# Patient Record
Sex: Male | Born: 1960 | Race: White | Hispanic: No | Marital: Married | Smoking: Former smoker
Health system: Southern US, Community
[De-identification: ages and names within clinical notes are randomized; demographics above are authoritative.]

## PROBLEM LIST (undated history)

## (undated) DIAGNOSIS — E785 Hyperlipidemia, unspecified: Secondary | ICD-10-CM

## (undated) DIAGNOSIS — T7840XA Allergy, unspecified, initial encounter: Secondary | ICD-10-CM

## (undated) DIAGNOSIS — K219 Gastro-esophageal reflux disease without esophagitis: Secondary | ICD-10-CM

## (undated) DIAGNOSIS — M5020 Other cervical disc displacement, unspecified cervical region: Secondary | ICD-10-CM

## (undated) HISTORY — DX: Gastro-esophageal reflux disease without esophagitis: K21.9

## (undated) HISTORY — PX: POLYPECTOMY: SHX149

## (undated) HISTORY — PX: COLON SURGERY: SHX602

## (undated) HISTORY — PX: COLONOSCOPY: SHX174

## (undated) HISTORY — DX: Allergy, unspecified, initial encounter: T78.40XA

## (undated) HISTORY — DX: Other cervical disc displacement, unspecified cervical region: M50.20

## (undated) HISTORY — DX: Hyperlipidemia, unspecified: E78.5

## (undated) HISTORY — PX: DENTAL SURGERY: SHX609

---

## 1998-06-18 ENCOUNTER — Emergency Department (HOSPITAL_COMMUNITY): Admission: EM | Admit: 1998-06-18 | Discharge: 1998-06-18 | Payer: Self-pay

## 2002-03-18 LAB — HM COLONOSCOPY

## 2003-12-16 ENCOUNTER — Emergency Department (HOSPITAL_COMMUNITY): Admission: EM | Admit: 2003-12-16 | Discharge: 2003-12-16 | Payer: Self-pay | Admitting: Emergency Medicine

## 2003-12-18 ENCOUNTER — Ambulatory Visit (HOSPITAL_COMMUNITY): Admission: RE | Admit: 2003-12-18 | Discharge: 2003-12-18 | Payer: Self-pay | Admitting: Internal Medicine

## 2005-02-18 ENCOUNTER — Ambulatory Visit: Payer: Self-pay | Admitting: Internal Medicine

## 2005-04-07 ENCOUNTER — Ambulatory Visit: Payer: Self-pay | Admitting: Internal Medicine

## 2005-11-21 ENCOUNTER — Ambulatory Visit: Payer: Self-pay | Admitting: Internal Medicine

## 2006-01-26 ENCOUNTER — Ambulatory Visit: Payer: Self-pay | Admitting: Gastroenterology

## 2007-02-03 ENCOUNTER — Ambulatory Visit: Payer: Self-pay | Admitting: Endocrinology

## 2007-08-27 ENCOUNTER — Encounter: Payer: Self-pay | Admitting: *Deleted

## 2007-08-27 DIAGNOSIS — E785 Hyperlipidemia, unspecified: Secondary | ICD-10-CM | POA: Insufficient documentation

## 2007-12-23 ENCOUNTER — Ambulatory Visit: Payer: Self-pay | Admitting: Internal Medicine

## 2008-03-23 ENCOUNTER — Ambulatory Visit: Payer: Self-pay | Admitting: Internal Medicine

## 2009-02-23 ENCOUNTER — Ambulatory Visit: Payer: Self-pay | Admitting: Internal Medicine

## 2010-07-29 ENCOUNTER — Telehealth: Payer: Self-pay | Admitting: Internal Medicine

## 2010-08-06 ENCOUNTER — Ambulatory Visit: Payer: Self-pay | Admitting: Internal Medicine

## 2010-08-06 LAB — CONVERTED CEMR LAB
ALT: 29 units/L (ref 0–53)
AST: 23 units/L (ref 0–37)
Albumin: 4.4 g/dL (ref 3.5–5.2)
Alkaline Phosphatase: 49 units/L (ref 39–117)
BUN: 17 mg/dL (ref 6–23)
Basophils Absolute: 0 10*3/uL (ref 0.0–0.1)
Basophils Relative: 0.5 % (ref 0.0–3.0)
Bilirubin Urine: NEGATIVE
Bilirubin, Direct: 0.1 mg/dL (ref 0.0–0.3)
CO2: 27 meq/L (ref 19–32)
Calcium: 9.3 mg/dL (ref 8.4–10.5)
Chloride: 105 meq/L (ref 96–112)
Cholesterol: 245 mg/dL — ABNORMAL HIGH (ref 0–200)
Creatinine, Ser: 1.2 mg/dL (ref 0.4–1.5)
Direct LDL: 176.2 mg/dL
Eosinophils Absolute: 0.2 10*3/uL (ref 0.0–0.7)
Eosinophils Relative: 3.5 % (ref 0.0–5.0)
GFR calc non Af Amer: 70.22 mL/min (ref >60)
Glucose, Bld: 87 mg/dL (ref 70–99)
HCT: 45.1 % (ref 39.0–52.0)
HDL: 51.1 mg/dL (ref >39.00)
Hemoglobin, Urine: NEGATIVE
Hemoglobin: 15.4 g/dL (ref 13.0–17.0)
Ketones, ur: NEGATIVE mg/dL
Leukocytes, UA: NEGATIVE
Lymphocytes Relative: 26.3 % (ref 12.0–46.0)
Lymphs Abs: 1.7 10*3/uL (ref 0.7–4.0)
MCHC: 34.2 g/dL (ref 30.0–36.0)
MCV: 87.2 fL (ref 78.0–100.0)
Monocytes Absolute: 0.6 10*3/uL (ref 0.1–1.0)
Monocytes Relative: 10.1 % (ref 3.0–12.0)
Neutro Abs: 3.8 10*3/uL (ref 1.4–7.7)
Neutrophils Relative %: 59.6 % (ref 43.0–77.0)
Nitrite: NEGATIVE
PSA: 0.38 ng/mL (ref 0.10–4.00)
Platelets: 190 10*3/uL (ref 150.0–400.0)
Potassium: 4.7 meq/L (ref 3.5–5.1)
RBC: 5.18 M/uL (ref 4.22–5.81)
RDW: 13.5 % (ref 11.5–14.6)
Sodium: 140 meq/L (ref 135–145)
Specific Gravity, Urine: 1.02 (ref 1.000–1.030)
TSH: 2.49 microintl units/mL (ref 0.35–5.50)
Total Bilirubin: 0.8 mg/dL (ref 0.3–1.2)
Total CHOL/HDL Ratio: 5
Total Protein, Urine: NEGATIVE mg/dL
Total Protein: 7 g/dL (ref 6.0–8.3)
Triglycerides: 127 mg/dL (ref 0.0–149.0)
Urine Glucose: NEGATIVE mg/dL
Urobilinogen, UA: 0.2 (ref 0.0–1.0)
VLDL: 25.4 mg/dL (ref 0.0–40.0)
WBC: 6.4 10*3/uL (ref 4.5–10.5)
pH: 7 (ref 5.0–8.0)

## 2010-08-13 ENCOUNTER — Ambulatory Visit: Payer: Self-pay | Admitting: Internal Medicine

## 2010-08-13 DIAGNOSIS — R6882 Decreased libido: Secondary | ICD-10-CM

## 2010-12-06 ENCOUNTER — Ambulatory Visit
Admission: RE | Admit: 2010-12-06 | Discharge: 2010-12-06 | Payer: Self-pay | Source: Home / Self Care | Attending: Internal Medicine | Admitting: Internal Medicine

## 2010-12-28 ENCOUNTER — Encounter: Payer: Self-pay | Admitting: Internal Medicine

## 2011-01-09 NOTE — Assessment & Plan Note (Signed)
Summary: swollen glands/cd   Vital Signs:  Patient profile:   50 year old male Height:      67 inches Weight:      164 pounds BMI:     25.78 O2 Sat:      97 % on Room air Temp:     97.4 degrees F oral Pulse rate:   66 / minute BP sitting:   138 / 84  (left arm) Cuff size:   regular  Vitals Entered By: Bill Salinas CMA (December 06, 2010 1:57 PM)  O2 Flow:  Room air CC: pt here for evaluation of swollen glands on the left side of his neck. Pt also has c/o cough with sinus pressure. Pt states he is not taking lovastatin/ ab   Primary Care Provider:  Jacques Navy MD  CC:  pt here for evaluation of swollen glands on the left side of his neck. Pt also has c/o cough with sinus pressure. Pt states he is not taking lovastatin/ ab.  History of Present Illness: Patinet presents for a swollen and pain in the left submandiblar/anterior cervical chain region for 2-3 days. He reports he did have some URI symptoms. The swollen gland was painful enough to disrupt sleep . He denies fever, rigors. the pain was like a tooth ache. This AM the left manidble is not tender and is OK.   Current Medications (verified): 1)  Lovastatin 20 Mg Tabs (Lovastatin) .Marland Kitchen.. 1 By Mouth Qpm  Allergies (verified): 1)  ! Cipro 2)  ! Ceftin  Past History:  Past Medical History: Last updated: 08/13/2010 HYPERLIPIDEMIA (ICD-272.4)  Past Surgical History: Last updated: 03/23/2008 dental surgery  Family History: Last updated: 03/23/2008 father-'37: lipid, CAD/CABG '08 mother-'40; Breast cancer - surgical cure Positive for CAD in many 2nd degree kin, maternla kin with hearing loss and ear     problems.  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  The patient denies anorexia, fever, weight loss, weight gain, decreased hearing,  hoarseness, chest pain, dyspnea on exertion, abdominal pain, severe indigestion/heartburn, muscle weakness, suspicious skin lesions, difficulty walking, depression, abnormal bleeding, and angioedema.         No Nausea or vomiting.  Physical Exam  General:  Well-developed,well-nourished,in no acute distress; alert,appropriate and cooperative throughout examination Head:  normocephalic, atraumatic, and no abnormalities observed.   Eyes:  vision grossly intact, pupils equal, pupils round, pupils react to accomodation, and corneas and lenses clear.   Ears:  R ear normal and L ear normal.   Mouth:  good dentition.  Throat clear. No tenderness to percussion against teeth. No mass in floor of the mouth Neck:  tender just  below the left submanibular gland at the anterior cervical chain. Palpable but not enlarged lymph node.  Lungs:  normal respiratory effort.   Heart:  normal rate and regular rhythm.     Impression & Recommendations:  Problem # 1:  ACUTE LYMPHADENITIS (ICD-683) Patient on exam with what is most likely to be lymphadenitis without other associated infection  Plan - Augmentin 875 two times a day for 7 days           heat or warm compresses; APAP for fever and pain.  His updated medication list for this problem includes:    Amoxicillin-pot Clavulanate 875-125 Mg Tabs (Amoxicillin-pot  clavulanate) .Marland Kitchen... 1 by mouth two times a day x 7  Complete Medication List: 1)  Lovastatin 20 Mg Tabs (Lovastatin) .Marland Kitchen.. 1 by mouth qpm 2)  Amoxicillin-pot Clavulanate 875-125 Mg Tabs (Amoxicillin-pot clavulanate) .Marland Kitchen.. 1 by mouth two times a day x 7  Patient Instructions: 1)  pain in the neck - most likely lymphadenitis. No primary site of infection noted: no tenderness to teeth or floor of the mouth and the ears look normal. Plan - Augmentin (generic) 875mg  two times a day x 7 days; Tylenol for pain and fever; warm compresses for swelling and discomfort. Call if this does not clear by Tuesday.    Prescriptions: AMOXICILLIN-POT CLAVULANATE 875-125 MG TABS (AMOXICILLIN-POT CLAVULANATE) 1 by mouth two times a day x 7  #14 x 0   Entered and Authorized by:   Jacques Navy MD   Signed by:   Jacques Navy MD on 12/06/2010   Method used:   Electronically to        [facility]* (retail)       [address]       [place], [place]  [address]       Ph: [phone]       Fax: [phone]   RxID:   [id number]    Orders Added: 1)  Est. Patient Level III [30160]

## 2011-01-09 NOTE — Assessment & Plan Note (Signed)
Summary: CPX /NWS  #   Vital Signs:  Patient profile:   50 year old male Height:      67 inches Weight:      162 pounds BMI:     25.46 O2 Sat:      95 % on Room air Temp:     97.3 degrees F oral Pulse rate:   73 / minute BP sitting:   110 / 80  (left arm) Cuff size:   regular  Vitals Entered By: Alysia Penna (August 13, 2010 2:18 PM)  O2 Flow:  Room air CC: cpx/ cp sma Comments pt will get tetanus today   Primary Care Graysin Luczynski:  Jacques Navy MD  CC:  cpx/ cp sma.  History of Present Illness: Patient presents for a general physical, the last being at age 46! He is feeling well and has no active problems.   Question about follow-up colonoscopy - had study at 42 for possible IBD. Will need to pull paper chart.  Has questions about ED and decreased libido. He does report that on the rare occasion when he and his wife had holiday alone there were no libido or ED issues. This suggests environmental obstacles.   Patient does have a history of hyperlipidemia in the setting of a family history positive for CAD. He had not follow up labs until labs done for this exam. He does exercise but admits that his diet is not a strict low fat diet.   Preventive Screening-Counseling & Management  Alcohol-Tobacco     Alcohol drinks/day: 1     Alcohol type: beer     Smoking Status: never  Caffeine-Diet-Exercise     Caffeine use/day: 1 cup a day     Diet Comments: poor     Diet Counseling: to improve diet; diet is suboptimal     Does Patient Exercise: yes     Type of exercise: aerobic, weights in season     Exercise (avg: min/session): 30-60     Exercise Counseling: to improve exercise regimen     MSH Depression Score: depression  Hep-HIV-STD-Contraception     STD Risk: no risk noted     Dental Visit-last 6 months yes     TSE monthly: yes     Testicular SE Education/Counseling to perform regular STE     Sun Exposure-Excessive: no  Safety-Violence-Falls     Seat Belt  Use: yes     Helmet Use: yes     Firearms in the Home: firearms in the home     Smoke Detectors: yes  Current Medications (verified): 1)  None  Allergies (verified): 1)  ! Cipro 2)  ! Ceftin  Past History:  Family History: Last updated: 03/23/2008 father-'37: lipid, CAD/CABG '08 mother-'40; Breast cancer - surgical cure Positive for CAD in many 2nd degree kin, maternla kin with hearing loss and ear     problems.  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 dental surgery  Social History: Smoking Status:  never Caffeine use/day:  1 cup a day Does Patient Exercise:  yes STD Risk:  no risk noted Dental Care w/in 6  mos.:  yes Sun Exposure-Excessive:  no Seat Belt Use:  yes  Review of Systems  The patient denies anorexia, fever, weight loss, weight gain, vision loss, decreased hearing, hoarseness, chest pain, syncope, dyspnea on exertion, peripheral edema, prolonged cough, headaches, hemoptysis, abdominal pain, melena, hematochezia, severe indigestion/heartburn, hematuria, incontinence, genital sores, muscle weakness, suspicious skin lesions, transient blindness, difficulty walking, depression, unusual weight change, enlarged lymph nodes, angioedema, and testicular masses.    Physical Exam  General:  Well-developed,well-nourished,in no acute distress; alert,appropriate and cooperative throughout examination Head:  Normocephalic and atraumatic without obvious abnormalities. No apparent alopecia or balding. Eyes:  No corneal or conjunctival  inflammation noted. EOMI. Perrla. Funduscopic exam benign, without hemorrhages, exudates or papilledema. Vision grossly normal. Ears:  External ear exam shows no significant lesions or deformities.  Otoscopic examination reveals clear canals, tympanic membranes are intact bilaterally without bulging, retraction, inflammation or discharge. Hearing is grossly normal bilaterally. Nose:  no external deformity, no external erythema, and no nasal discharge.   Mouth:  Oral mucosa and oropharynx without lesions or exudates.  Teeth in good repair. Neck:  supple, full ROM, no thyromegaly, and no carotid bruits.   Chest Wall:  no deformities.   Lungs:  Normal respiratory effort, chest expands symmetrically. Lungs are clear to auscultation, no crackles or wheezes. Heart:  Normal rate and regular rhythm. S1 and S2 normal without gallop, murmur, click, rub or other extra sounds. Abdomen:  soft, non-tender, normal bowel sounds, no guarding, and no hepatomegaly.   Genitalia:  circumcised, no hydrocele, no varicocele, no scrotal masses, and no testicular masses or atrophy.   Prostate:  deferred to PSA Msk:  normal ROM, no joint tenderness, no joint warmth, and no joint deformities.   Pulses:  2+ radial and DP pulses Extremities:  No clubbing, cyanosis, edema, or deformity noted with normal full range of motion of all joints.   Neurologic:  alert & oriented X3, cranial nerves II-XII intact, strength normal in all extremities, gait normal, and DTRs symmetrical and normal.   Skin:  turgor normal, color normal, no suspicious lesions, and no ulcerations.   Cervical Nodes:  no anterior cervical adenopathy and no posterior cervical adenopathy.   Axillary Nodes:  no R axillary adenopathy and no L axillary adenopathy.   Inguinal Nodes:  no R inguinal adenopathy and no L inguinal adenopathy.   Psych:  Oriented X3, normally interactive, good eye contact, and not anxious appearing.     Impression &  Recommendations:  Problem # 1:  DECREASED LIBIDO (ICD-799.81) By history this seems to be a situational/environmental issue with busy job and two teenagers - absence of time for romance.  Plan - testosterone level at next lab draw           long week-end holiday for the adults, every quarter would be ideal.  Problem # 2:  HYPERLIPIDEMIA (ICD-272.4)  His updated medication list for this problem includes:    Lovastatin 20 Mg Tabs (Lovastatin) .Marland Kitchen... 1 by mouth qpm  Labs Reviewed: SGOT: 23 (08/06/2010)   SGPT: 29 (08/06/2010)   HDL:51.10 (08/06/2010)  Chol:245 (08/06/2010)  Trig:127.0 (08/06/2010) LDL: 176(08/06/2010)  Patient with several cardiac risk factors including very positive family history. Per NCEP guidelines goal is an LDL 130 or less, lower always better.   Plan - will start Lovastatin 20mg  qPM. discussed mechanism of action, risks and follow-up strategy- labs in 3+ weeks           Low fat diet. At 4 months if continuing  on conscientious low fat diet will offer drug holiday and recheck of native cholesterol levels.            Problem # 3:  Preventive Health Care (ICD-V70.0) Unremarkable history. Normal phyiscal exam. Lab results, except for cholesterol, are normal. Recommend next full physical exam at age 40.  In summary - a very nice man in good health with hyperlipidemia that requires treatment. He will return for lipid follow-up as indicated, for full exam in 3 years.   Complete Medication List: 1)  Lovastatin 20 Mg Tabs (Lovastatin) .Marland Kitchen.. 1 by mouth qpm  Other Orders: Tdap => 48yrs IM ([address]) Admin 1st Vaccine (60454)   Patient: Cameron Larsen Note: All result statuses are Final unless otherwise noted.  Tests: (1) BMP (METABOL)   Sodium                    140 mEq/L                   135-145   Potassium                 4.7 mEq/L                   3.5-5.1   Chloride                  105 mEq/L                   96-112   Carbon Dioxide            27 mEq/L                     19-32   Glucose                   87 mg/dL                    09-81   BUN                       17 mg/dL                    1-91   Creatinine                1.2 mg/dL                   4.7-8.2   Calcium                   9.3 mg/dL                   9.5-62.1   GFR                       70.22 mL/min                >60  Tests: (2) CBC Platelet w/Diff (CBCD)   White Cell Count          6.4 K/uL                    4.5-10.5   Red Cell Count            5.18 Mil/uL                 4.22-5.81   Hemoglobin  15.4 g/dL                   04.5-40.9   Hematocrit                45.1 %                      39.0-52.0   MCV                       87.2 fl                     78.0-100.0   MCHC                      34.2 g/dL                   81.1-91.4   RDW                       13.5 %                      11.5-14.6   Platelet Count            190.0 K/uL                  150.0-400.0   Neutrophil %              59.6 %                      43.0-77.0   Lymphocyte %              26.3 %                      12.0-46.0   Monocyte %                10.1 %                      3.0-12.0   Eosinophils%              3.5 %                       0.0-5.0   Basophils %               0.5 %                       0.0-3.0   Neutrophill Absolute      3.8 K/uL                    1.4-7.7   Lymphocyte Absolute       1.7 K/uL                    0.7-4.0   Monocyte Absolute         0.6 K/uL                    0.1-1.0  Eosinophils, Absolute                             0.2 K/uL                    0.0-0.7  Basophils Absolute        0.0 K/uL                    0.0-0.1  Tests: (3) Hepatic/Liver Function Panel (HEPATIC)   Total Bilirubin           0.8 mg/dL                   8.2-9.5   Direct Bilirubin          0.1 mg/dL                   6.2-1.3   Alkaline Phosphatase      49 U/L                      39-117   AST                       23 U/L                      0-37   ALT                       29 U/L                       0-53   Total Protein             7.0 g/dL                    0.8-6.5   Albumin                   4.4 g/dL                    7.8-4.6  Tests: (4) TSH (TSH)   FastTSH                   2.49 uIU/mL                 0.35-5.50  Tests: (5) Lipid Panel (LIPID)   Cholesterol          [H]  245 mg/dL                   9-629     ATP III Classification            Desirable:  < 200 mg/dL                    Borderline High:  200 - 239 mg/dL               High:  > = 240 mg/dL   Triglycerides             127.0 mg/dL                 5.2-841.3     Normal:  <150 mg/dL     Borderline High:  244 - 199 mg/dL   HDL                       01.02 mg/dL                 >72.53   VLDL Cholesterol          25.4 mg/dL  0.0-40.0  CHO/HDL Ratio:  CHD Risk                             5                    Men          Women     1/2 Average Risk     3.4          3.3     Average Risk          5.0          4.4     2X Average Risk          9.6          7.1     3X Average Risk          15.0          11.0                           Tests: (6) UDip Only (UDIP)   Color                     LT. YELLOW       RANGE:  Yellow;Lt. Yellow   Clarity                   CLEAR                       Clear   Specific Gravity          1.020                       1.000 - 1.030   Urine Ph                  7.0                         5.0-8.0   Protein                   NEGATIVE                    Negative   Urine Glucose             NEGATIVE                    Negative   Ketones                   NEGATIVE                    Negative   Urine Bilirubin           NEGATIVE                    Negative   Blood                     NEGATIVE                    Negative   Urobilinogen              0.2  0.0 - 1.0   Leukocyte Esterace        NEGATIVE                    Negative   Nitrite                   NEGATIVE                    Negative  Tests: (7) Prostate Specific Antigen (PSA)   PSA-Hyb                    0.38 ng/mL                  0.10-4.00  Tests: (8) Cholesterol LDL - Direct (DIRLDL)  Cholesterol LDL - Direct                             176.2 mg/dL     Optimal:  <161 mg/dL     Near or Above Optimal:  100-129 mg/dL     Borderline High:  096-045 mg/dL     High:  409-811 mg/dL     Very High:  >914 mg/dL Prescriptions: LOVASTATIN 20 MG TABS (LOVASTATIN) 1 by mouth qPM  #30 x 12   Entered and Authorized by:   Jacques Navy MD   Signed by:   Jacques Navy MD on 08/13/2010   Method used:   Electronically to        [facility]* (retail)       [address]       [place], [place]  [address]       Ph: [phone]       Fax: [phone]   RxID:   [id number]    Immunizations Administered:  Tetanus Vaccine:    Vaccine Type: Tdap    Site: left deltoid    Mfr: GlaxoSmithKline    Dose: 0.5 ml    Route: IM    Given by: Ami Bullins CMA    Exp. Date: 08/28/2012    Lot #: [id number]    VIS given: 10/25/08 version given August 13, 2010.

## 2011-01-09 NOTE — Progress Notes (Signed)
Summary: CPX question  Phone Note Call from Patient Call back at Work Phone [phone]   Caller: Patient Summary of Call: Patient called [facility] stating that his last CPX was done 42yrs ago. He is requesting to know how often he should have this done, if it is time then pt would like to set up appt. Initial call taken by: Rock Nephew CMA,  July 29, 2010 2:03 PM  Follow-up for Phone Call        Patient notified that CPX is usually done yearly. Patient will schedule appt. Follow-up by: Lucious Groves CMA,  July 29, 2010 2:26 PM

## 2011-08-07 ENCOUNTER — Encounter: Payer: Self-pay | Admitting: Internal Medicine

## 2011-08-07 ENCOUNTER — Telehealth: Payer: Self-pay

## 2011-08-07 NOTE — Telephone Encounter (Signed)
Patient transferred from scheduling to be triage for chest tightness. Patient reports chest tightness x several days with occassional numbness, slight tingling down left arm. He states that he has had increased stress and thinks that may be the cause. MD spoke with patient and advised ok to see him 08/08/11 9am, however should anything change then report to ER overnight.

## 2011-08-08 ENCOUNTER — Ambulatory Visit (INDEPENDENT_AMBULATORY_CARE_PROVIDER_SITE_OTHER): Payer: PRIVATE HEALTH INSURANCE | Admitting: Internal Medicine

## 2011-08-08 VITALS — BP 138/92 | HR 89 | Temp 97.0°F | Wt 162.0 lb

## 2011-08-08 DIAGNOSIS — F411 Generalized anxiety disorder: Secondary | ICD-10-CM

## 2011-08-08 DIAGNOSIS — F419 Anxiety disorder, unspecified: Secondary | ICD-10-CM

## 2011-08-08 DIAGNOSIS — R0789 Other chest pain: Secondary | ICD-10-CM

## 2011-08-10 ENCOUNTER — Encounter: Payer: Self-pay | Admitting: Internal Medicine

## 2011-08-10 DIAGNOSIS — F419 Anxiety disorder, unspecified: Secondary | ICD-10-CM | POA: Insufficient documentation

## 2011-08-10 NOTE — Assessment & Plan Note (Signed)
Admits to anxiety and that he doesn't cope well. No serious problems at home and he does have good support.  Plan - recommended counseling/therapy to develop better coping mechanisms. Will provide referral upon request.

## 2011-08-10 NOTE — Progress Notes (Signed)
  Subjective:    Patient ID: Christiane Ha, male    DOB: 03/19/1961, 50 y.o.   MRN: [id number]  HPI Mr. Szczesny is seen acutely for left sided chest pain that started yesterday. The nature is sharp, fleeting and intermittent without associated symptoms of diaphoresis, SOB, radiation. Cardiac risk profile is low. He has had no limitation in his activities.  He reports that he is under a great deal of stress at work. He acknowledges that this has an adverse affect on him.  I have reviewed the patient's medical history in detail and updated the computerized patient record.    Review of Systems System review is negative for any constitutional, pulmonary, GI or neuro symptoms or complaints     Objective:   Physical Exam Vitals reviewed- normal Gen'l- WNWD white male in no distress HEENT - C&S clear Cor - 2+ radial pulse, quiet precordium, RRR no murmurs, rubs or gallops Chest - clear  12 lead EKG - normal sinus rhythm, no evidence of injury or acute ischemia.       Assessment & Plan:  1. Chest pain - atypical in nature, EKG normal, risk profile low-moderate.  Plan - no further evaluation at this time

## 2013-08-04 ENCOUNTER — Telehealth: Payer: Self-pay | Admitting: Internal Medicine

## 2013-08-04 DIAGNOSIS — Z1211 Encounter for screening for malignant neoplasm of colon: Secondary | ICD-10-CM

## 2013-08-04 NOTE — Telephone Encounter (Signed)
Patient is requesting a referral for a colonoscopy.

## 2013-08-04 NOTE — Telephone Encounter (Signed)
done

## 2013-09-23 ENCOUNTER — Encounter: Payer: Self-pay | Admitting: Internal Medicine

## 2014-04-17 ENCOUNTER — Telehealth: Payer: Self-pay | Admitting: Internal Medicine

## 2014-04-17 ENCOUNTER — Encounter (HOSPITAL_COMMUNITY): Payer: Self-pay | Admitting: Emergency Medicine

## 2014-04-17 ENCOUNTER — Emergency Department (HOSPITAL_COMMUNITY)
Admission: EM | Admit: 2014-04-17 | Discharge: 2014-04-17 | Disposition: A | Discharge status: Home / Self Care | Payer: PRIVATE HEALTH INSURANCE | Attending: Emergency Medicine | Admitting: Emergency Medicine

## 2014-04-17 ENCOUNTER — Emergency Department (HOSPITAL_COMMUNITY): Payer: PRIVATE HEALTH INSURANCE

## 2014-04-17 DIAGNOSIS — Z8639 Personal history of other endocrine, nutritional and metabolic disease: Secondary | ICD-10-CM | POA: Insufficient documentation

## 2014-04-17 DIAGNOSIS — Z862 Personal history of diseases of the blood and blood-forming organs and certain disorders involving the immune mechanism: Secondary | ICD-10-CM | POA: Insufficient documentation

## 2014-04-17 DIAGNOSIS — R1013 Epigastric pain: Secondary | ICD-10-CM

## 2014-04-17 DIAGNOSIS — F430 Acute stress reaction: Secondary | ICD-10-CM | POA: Insufficient documentation

## 2014-04-17 DIAGNOSIS — F411 Generalized anxiety disorder: Secondary | ICD-10-CM | POA: Insufficient documentation

## 2014-04-17 DIAGNOSIS — Z792 Long term (current) use of antibiotics: Secondary | ICD-10-CM | POA: Insufficient documentation

## 2014-04-17 LAB — COMPREHENSIVE METABOLIC PANEL
ALT: 32 U/L (ref 0–53)
AST: 21 U/L (ref 0–37)
Albumin: 3.9 g/dL (ref 3.5–5.2)
Alkaline Phosphatase: 57 U/L (ref 39–117)
BUN: 13 mg/dL (ref 6–23)
CO2: 23 meq/L (ref 19–32)
Calcium: 8.9 mg/dL (ref 8.4–10.5)
Chloride: 107 mEq/L (ref 96–112)
Creatinine, Ser: 0.86 mg/dL (ref 0.50–1.35)
GFR calc non Af Amer: >90 mL/min (ref >90)
Glucose, Bld: 85 mg/dL (ref 70–99)
POTASSIUM: 4.2 meq/L (ref 3.7–5.3)
SODIUM: 143 meq/L (ref 137–147)
Total Bilirubin: 0.3 mg/dL (ref 0.3–1.2)
Total Protein: 7 g/dL (ref 6.0–8.3)

## 2014-04-17 LAB — CBC
HCT: 45 % (ref 39.0–52.0)
Hemoglobin: 15.2 g/dL (ref 13.0–17.0)
MCH: 29.1 pg (ref 26.0–34.0)
MCHC: 33.8 g/dL (ref 30.0–36.0)
MCV: 86 fL (ref 78.0–100.0)
Platelets: 197 10*3/uL (ref 150–400)
RBC: 5.23 MIL/uL (ref 4.22–5.81)
RDW: 13.3 % (ref 11.5–15.5)
WBC: 6.1 10*3/uL (ref 4.0–10.5)

## 2014-04-17 LAB — I-STAT TROPONIN, ED
Troponin i, poc: 0 ng/mL (ref 0.00–0.08)
Troponin i, poc: 0 ng/mL (ref 0.00–0.08)

## 2014-04-17 LAB — LIPASE, BLOOD: Lipase: 49 U/L (ref 11–59)

## 2014-04-17 MED ORDER — OMEPRAZOLE 20 MG PO CPDR: 20.0000 mg | DELAYED_RELEASE_CAPSULE | Freq: Every day | ORAL | Status: DC

## 2014-04-17 MED ORDER — SODIUM CHLORIDE 0.9 % IV SOLN
INTRAVENOUS | Status: DC
Start: 2014-04-17 — End: 2014-04-17
  Administered 2014-04-17: 13:00:00 via INTRAVENOUS

## 2014-04-17 MED ORDER — GI COCKTAIL ~~LOC~~
30.0000 mL | Freq: Once | ORAL | Status: AC
  Administered 2014-04-17: 30 mL via ORAL
  Filled 2014-04-17: qty 30

## 2014-04-17 MED ORDER — PANTOPRAZOLE SODIUM 40 MG IV SOLR
40.0000 mg | Freq: Once | INTRAVENOUS | Status: AC
  Administered 2014-04-17: 40 mg via INTRAVENOUS
  Filled 2014-04-17: qty 40

## 2014-04-17 NOTE — ED Notes (Signed)
Labs for lipase and bmp recollected. PA notified of delay. Pt ambulatory to restroom, updated on delay. Family at bedside

## 2014-04-17 NOTE — Discharge Instructions (Signed)
Peptic Ulcer A peptic ulcer is a sore in the lining of in your esophagus (esophageal ulcer), stomach (gastric ulcer), or in the first part of your small intestine (duodenal ulcer). The ulcer causes erosion into the deeper tissue. CAUSES  Normally, the lining of the stomach and the small intestine protects itself from the acid that digests food. The protective lining can be damaged by:  An infection caused by a bacterium called Helicobacter pylori (H. pylori).  Regular use of nonsteroidal anti-inflammatory drugs (NSAIDs), such as ibuprofen or aspirin.  Smoking tobacco. Other risk factors include being older than 50, drinking alcohol excessively, and having a family history of ulcer disease.  SYMPTOMS   Burning pain or gnawing in the area between the chest and the belly button.  Heartburn.  Nausea and vomiting.  Bloating. The pain can be worse on an empty stomach and at night. If the ulcer results in bleeding, it can cause:  Black, tarry stools.  Vomiting of bright red blood.  Vomiting of coffee ground looking materials. DIAGNOSIS  A diagnosis is usually made based upon your history and an exam. Other tests and procedures may be performed to find the cause of the ulcer. Finding a cause will help determine the best treatment. Tests and procedures may include:  Blood tests, stool tests, or breath tests to check for the bacterium H. pylori.  An upper gastrointestinal (GI) series of the esophagus, stomach, and small intestine.  An endoscopy to examine the esophagus, stomach, and small intestine.  A biopsy. TREATMENT  Treatment may include:  Eliminating the cause of the ulcer, such as smoking, NSAIDs, or alcohol.  Medicines to reduce the amount of acid in your digestive tract.  Antibiotic medicines if the ulcer is caused by the H. pylori bacterium.  An upper endoscopy to treat a bleeding ulcer.  Surgery if the bleeding is severe or if the ulcer created a hole somewhere in the  digestive system. HOME CARE INSTRUCTIONS   Avoid tobacco, alcohol, and caffeine. Smoking can increase the acid in the stomach, and continued smoking will impair the healing of ulcers.  Avoid foods and drinks that seem to cause discomfort or aggravate your ulcer.  Only take medicines as directed by your caregiver. Do not substitute over-the-counter medicines for prescription medicines without talking to your caregiver.  Keep any follow-up appointments and tests as directed. SEEK MEDICAL CARE IF:   Your do not improve within 7 days of starting treatment.  You have ongoing indigestion or heartburn. SEEK IMMEDIATE MEDICAL CARE IF:   You have sudden, sharp, or persistent abdominal pain.  You have bloody or dark black, tarry stools.  You vomit blood or vomit that looks like coffee grounds.  You become light headed, weak, or feel faint.  You become sweaty or clammy. MAKE SURE YOU:   Understand these instructions.  Will watch your condition.  Will get help right away if you are not doing well or get worse. Document Released: 11/21/2000 Document Revised: 08/18/2012 Document Reviewed: 06/23/2012 [employer] Patient Information 2014 [employer].  

## 2014-04-17 NOTE — ED Notes (Signed)
PA student with patient at this time for update

## 2014-04-17 NOTE — ED Notes (Signed)
Diet coke given °

## 2014-04-17 NOTE — ED Provider Notes (Signed)
CSN: [id number]     Arrival date & time 04/17/14  1004 History   First MD Initiated Contact with Patient 04/17/14 1136     Chief Complaint  Patient presents with  . Chest Pain     (Consider location/radiation/quality/duration/timing/severity/associated sxs/prior Treatment) HPI  Patient to the ED with complaints of pain to the epigastric region that has been happening intermittently over the past 6 weeks. He reports that he is active, walking and jogging and does not have pain during physical activities. The three worst episodes of pain were after eating "really bad fried or fatty meals". He took [employer] with relief. He called his PCP today to schedule an appointment but they instead referred him to the ED. He admits to having a family hx of cardiac disease, his grandpa died this past week at 30 years old. Had a bypass at age 52. He is also concerned because his friend was having the same symptoms as him yesterday and went to the ER to be told he had a heart attack. He admits to being very anxious and under a lot of stress lately. He denies any nausea, vomiting, diaphoresis, diarrhea, weakness, headache, abdominal pain, hx of GERD or gastric ulcers.  Past Medical History  Diagnosis Date  . Hyperlipidemia    Past Surgical History  Procedure Laterality Date  . Dental surgery     Family History  Problem Relation Age of Onset  . Breast cancer Mother   . Coronary artery disease Father     CABG 2008  . Hyperlipidemia Father   . Coronary artery disease Other   . Hearing loss Other    History  Substance Use Topics  . Smoking status: Never Smoker   . Smokeless tobacco: Never Used  . Alcohol Use: Yes    Review of Systems   Review of Systems  Gen: no weight loss, fevers, chills, night sweats  Eyes: no discharge or drainage, no occular pain or visual changes  Nose: no epistaxis or rhinorrhea  Mouth: no dental pain, no sore throat  Neck: no neck pain  Lungs:No wheezing,  coughing or hemoptysis CV: + chest pain, No palpitations, dependent edema or orthopnea  Abd: no abdominal pain, nausea, vomiting, diarrhea GU: no dysuria or gross hematuria  MSK:  No muscle weakness or pain Neuro: no headache, no focal neurologic deficits  Skin: no rash or wounds Psyche: no complaints    Allergies  Cefuroxime axetil and Ciprofloxacin  Home Medications   Prior to Admission medications   Medication Sig Start Date End Date Taking? Authorizing Provider  acidophilus (RISAQUAD) CAPS capsule Take 1 capsule by mouth daily.   Yes Historical Provider, MD   BP 120/63  Pulse 67  Temp(Src) 98.1 F (36.7 C) (Oral)  Resp 18  Ht 5\' 7"  (1.702 m)  Wt 155 lb (70.308 kg)  BMI 24.27 kg/m2  SpO2 97% Physical Exam  Nursing note and vitals reviewed. Constitutional: He appears well-developed and well-nourished. No distress.  HENT:  Head: Normocephalic and atraumatic.  Eyes: Pupils are equal, round, and reactive to light.  Neck: Normal range of motion. Neck supple.  Cardiovascular: Normal rate and regular rhythm.   Pulmonary/Chest: Effort normal.  Abdominal: Soft.  Neurological: He is alert.  Skin: Skin is warm and dry.    ED Course  Procedures (including critical care time) Labs Review Labs Reviewed  CBC  LIPASE, BLOOD  COMPREHENSIVE METABOLIC PANEL  I-STAT TROPOININ, ED  Randolm Idol, ED    Imaging Review Dg  Chest 2 View  04/17/2014   CLINICAL DATA:  Mid chest pain, epigastric pain.  EXAM: CHEST  2 VIEW  COMPARISON:  None.  FINDINGS: The heart size and mediastinal contours are within normal limits. Both lungs are clear. The visualized skeletal structures are unremarkable.  IMPRESSION: No active cardiopulmonary disease.   Electronically Signed   By: Rolm Baptise M.D.   On: 04/17/2014 12:35     EKG Interpretation   Date/Time:  Monday Apr 17 2014 10:07:48 EDT Ventricular Rate:  80 PR Interval:  156 QRS Duration: 90 QT Interval:  366 QTC Calculation: 422 R  Axis:   51 Text Interpretation:  Normal sinus rhythm Normal ECG Confirmed by HORTON   MD, COURTNEY ([id number]) on 04/17/2014 11:29:19 AM      MDM   Final diagnoses:  Epigastric pain   The patients symptoms are not consistent with ACS. His symptoms are consistent with peptic ulcer. He was given a GI cocktail and Protonix and his symptoms completely resolved. He had two Troponins drawn, > 3 hours a part and both are negative. Normal chest xray and EKG. Patient will be started on Prilosec and given referrals to cardiology(per his request) and GI. He is reassured and comfortable going home. Given strict return to ED precautions. Pt discussed with Dr. Cassandria Anger before discharge and she agrees with treatment and plan.  53 y.o.Cameron Larsen [facility] evaluation in the Emergency Department is complete. It has been determined that no acute conditions requiring further emergency intervention are present at this time. The patient/guardian have been advised of the diagnosis and plan. We have discussed signs and symptoms that warrant return to the ED, such as changes or worsening in symptoms.  Vital signs are stable at discharge. Filed Vitals:   04/17/14 1628  BP: 123/85  Pulse: 58  Temp:   Resp: 16    Patient/guardian has voiced understanding and agreed to follow-up with the PCP or specialist.     Linus Mako, PA-C 04/17/14 1633

## 2014-04-17 NOTE — ED Notes (Signed)
Pt c/o epigastric pain onset last night. Pt reports diaphoresis and palpations at onset of pain. Pt tried alkaseltzer with some relief. Pt reports family history of heart disease.

## 2014-04-17 NOTE — Telephone Encounter (Signed)
Patient Information:  Caller Name: Audrick  Phone: [phone]  Patient: Cameron, Larsen  Gender: Male  DOB: Mar 08, 1961  Age: 53 Years  PCP: Dr Linda Hedges  Office Follow Up:  Does the office need to follow up with this patient?: No  Instructions For The Office: N/A  RN Note:  No chest discomfort since 0430. Chest pain felt  like "bad heartburn" with heart racing.  Symptoms resolved after took antacid. Noted > episodes of intermittent heartburn. Reports extra stressful weekend due to funeral for family member. Educated that chest pain for > 5 minutes means immediate 911 evaluation.  Reviewed risk factors including male > age 77, dyslipidemia, and stress.  Discussed office vs ED disposition with Neesha/office nurse.  Advised to go to [facility] ED now.  Symptoms  Reason For Call & Symptoms: Intermittent substernal chest pain.  Chest pain woke him at 0330 and lasted about 1 hour. Felt weakness and mild sweat on forehead.  Reviewed Health History In EMR: Yes  Reviewed Medications In EMR: Yes  Reviewed Allergies In EMR: Yes  Reviewed Surgeries / Procedures: Yes  Date of Onset of Symptoms: 04/17/2014  Treatments Tried: Took an antacid  Treatments Tried Worked: Yes  Guideline(s) Used:  Chest Pain  Disposition Per Guideline:   Go to ED Now (or to Office with PCP Approval)  Reason For Disposition Reached:   Chest pain lasting longer than 5 minutes  Advice Given:  Fleeting Chest Pain:  Fleeting chest pains that last only a few seconds and then go away are generally not serious. They may be from pinched muscles or nerves in your chest wall.  Chest Pain Only When Coughing:  Chest pains that occur with coughing generally come from the chest wall and from irritation of the airways. They are usually not serious.  Call Back If:  Severe chest pain  Constant chest pain lasting longer than 5 minutes  You become worse.  RN Overrode Recommendation:  Go To ED  Per Neesha/office nurse

## 2014-04-19 ENCOUNTER — Encounter: Payer: Self-pay | Admitting: Nurse Practitioner

## 2014-04-19 NOTE — ED Provider Notes (Signed)
Medical screening examination/treatment/procedure(s) were performed by non-physician practitioner and as supervising physician I was immediately available for consultation/collaboration.   EKG Interpretation   Date/Time:  Monday Apr 17 2014 10:07:48 EDT Ventricular Rate:  80 PR Interval:  156 QRS Duration: 90 QT Interval:  366 QTC Calculation: 422 R Axis:   51 Text Interpretation:  Normal sinus rhythm Normal ECG Confirmed by Dina Rich   MD, Marae Cottrell ([id number]) on 04/17/2014 11:29:19 AM        Merryl Hacker, MD 04/19/14 [phone]

## 2014-04-24 ENCOUNTER — Ambulatory Visit (INDEPENDENT_AMBULATORY_CARE_PROVIDER_SITE_OTHER): Payer: PRIVATE HEALTH INSURANCE | Admitting: Nurse Practitioner

## 2014-04-24 ENCOUNTER — Encounter: Payer: Self-pay | Admitting: Nurse Practitioner

## 2014-04-24 VITALS — BP 118/66 | HR 84 | Ht 67.0 in | Wt 166.0 lb

## 2014-04-24 DIAGNOSIS — K219 Gastro-esophageal reflux disease without esophagitis: Secondary | ICD-10-CM

## 2014-04-24 DIAGNOSIS — R1013 Epigastric pain: Secondary | ICD-10-CM

## 2014-04-24 DIAGNOSIS — Z1211 Encounter for screening for malignant neoplasm of colon: Secondary | ICD-10-CM

## 2014-04-24 MED ORDER — OMEPRAZOLE 20 MG PO CPDR: 20.0000 mg | DELAYED_RELEASE_CAPSULE | Freq: Every day | ORAL | Status: DC

## 2014-04-24 NOTE — Patient Instructions (Signed)
You have been scheduled for a colonoscopy with propofol. Please follow written instructions given to you at your visit today.  We sent a prescription for refills for Prilosec 20 mg to [facility]. If you use inhalers (even only as needed), please bring them with you on the day of your procedure. Your physician has requested that you go to www.startemmi.com and enter the access code given to you at your visit today. This web site gives a general overview about your procedure. However, you should still follow specific instructions given to you by our office regarding your preparation for the procedure.

## 2014-04-25 ENCOUNTER — Encounter: Payer: Self-pay | Admitting: Nurse Practitioner

## 2014-04-25 DIAGNOSIS — R1013 Epigastric pain: Secondary | ICD-10-CM | POA: Insufficient documentation

## 2014-04-25 DIAGNOSIS — K219 Gastro-esophageal reflux disease without esophagitis: Secondary | ICD-10-CM | POA: Insufficient documentation

## 2014-04-25 NOTE — Progress Notes (Signed)
HPI :  Patient is a 53 year old male known remotely to Dr. Deatra Ina.  Patient has been having intermittent ,nonradiating epigastric pain over the last few weeks. Pain not really related to eating. No associated nausea. Alka-Seltzer helps the pain. Sunday patient woke up with a severe episode of this pain. He was concerned about cardiac problems given family history of cardiac disease. Patient went to the emergency department for evaluation. Lipase, troponin, comprehensive metabolic profile and CBC were all normal. Chest x-ray was normal. Normal sinus rhythm on EKG. Patient was prescribed Prilosec. Since starting daily Prilosec he's had no further pain. He takes 1-2 ibuprofen once a week . Patient had a recent death in the family, he is prone to some mild anxiety and wonders if the severe pain on Sunday may have been related to anxiety. He is scheduled to see cardiology to rule out underlying cardiac disease.  Patient has a history of acid reflux which started last August. The gastric bouts of reflux but fine in between the episodes. No dysphagia.  No other gastrointestinal problems. Of note, patient had a flexible sigmoidoscopy March 2003 with findings of left-sided colitis. He had a full colonoscopy the following month with endoscopic findings of left-sided ulcerative colitis. Biopsies were apparently done but I am unable to locate them, (I even called the pathology department). The patient did take mesalamine for about a year. Following that he went on a mineral diet, discontinued mesalamine and has done well over the last 11-12 years. Bowel movements are normal, no rectal bleeding.   Past Medical History  Diagnosis Date  . Hyperlipidemia    Family History  Problem Relation Age of Onset  . Breast cancer Mother   . Coronary artery disease Father     CABG 2008  . Hyperlipidemia Father   . Coronary artery disease Other   . Hearing loss Other    History  Substance Use Topics  . Smoking  status: Never Smoker   . Smokeless tobacco: Never Used  . Alcohol Use: Yes   Current Outpatient Prescriptions  Medication Sig Dispense Refill  . acidophilus (RISAQUAD) CAPS capsule Take 1 capsule by mouth daily.      Marland Kitchen omeprazole (PRILOSEC) 20 MG capsule Take 1 capsule (20 mg total) by mouth daily.  30 capsule  3   No current facility-administered medications for this visit.   Allergies  Allergen Reactions  . Cefuroxime Axetil   . Ciprofloxacin     fever    Review of Systems: All systems reviewed and negative except where noted in HPI.   Physical Exam: BP 118/66  Pulse 84  Ht 5\' 7"  (1.702 m)  Wt 166 lb (75.297 kg)  BMI 25.99 kg/m2 Constitutional: Pleasant,well-developed, white male in no acute distress. HEENT: Normocephalic and atraumatic. Conjunctivae are normal. No scleral icterus. Neck supple.  Cardiovascular: Normal rate, regular rhythm.  Pulmonary/chest: Effort normal and breath sounds normal. No wheezing, rales or rhonchi. Abdominal: Soft, nondistended, nontender. Bowel sounds active throughout. There are no masses palpable. No hepatomegaly. Extremities: no edema Lymphadenopathy: No cervical adenopathy noted. Neurological: Alert and oriented to person place and time. Skin: Skin is warm and dry. No rashes noted. Psychiatric: Normal mood and affect. Behavior is normal.   ASSESSMENT AND PLAN:  38. 53 year old male with a several week history of intermittent, nonradiating epigastric pain, resolved with daily Prilosec prescribed by emergency department. ED labs and chest x-ray were normal. His abdominal exam is benign, no worrisome features such as weight loss and  symptoms improved on daily PPI. Will hold off on any further workup at this time. Patient will complete the 30 day supply of Prilosec. If pain recurs following that then patient will need further workup up, possibly EGD and/or imaging studies. Patient will see cardiology to rule out cardiac source of pain. I asked  him let us know if cardiac evaluation undertaken as we may need to postpone colonoscopy.   2. Colon cancer screening. The risks, benefits, and alternatives to colonoscopy with possible biopsy and possible polypectomy were discussed with the patient and he consents to proceed.   3. ? Remote history of UC (see HPI).Endoscopic UC findings in 2003 but histology not available and patient asymptomatic (off meds) for over 12 years now.

## 2014-04-25 NOTE — Progress Notes (Signed)
Reviewed and agree with management. Robert D. Kaplan, M.D., FACG  

## 2014-05-05 ENCOUNTER — Ambulatory Visit (INDEPENDENT_AMBULATORY_CARE_PROVIDER_SITE_OTHER): Payer: PRIVATE HEALTH INSURANCE | Admitting: Cardiology

## 2014-05-05 ENCOUNTER — Encounter: Payer: Self-pay | Admitting: Cardiology

## 2014-05-05 VITALS — BP 140/88 | HR 70 | Ht 67.0 in | Wt 167.0 lb

## 2014-05-05 DIAGNOSIS — E7800 Pure hypercholesterolemia, unspecified: Secondary | ICD-10-CM

## 2014-05-05 DIAGNOSIS — Z8249 Family history of ischemic heart disease and other diseases of the circulatory system: Secondary | ICD-10-CM

## 2014-05-05 DIAGNOSIS — R079 Chest pain, unspecified: Secondary | ICD-10-CM

## 2014-05-05 DIAGNOSIS — K219 Gastro-esophageal reflux disease without esophagitis: Secondary | ICD-10-CM

## 2014-05-05 LAB — LIPID PANEL
Cholesterol: 256 mg/dL — ABNORMAL HIGH (ref 0–200)
HDL: 51.2 mg/dL (ref >39.00)
LDL CALC: 173 mg/dL — AB (ref 0–99)
TRIGLYCERIDES: 160 mg/dL — AB (ref 0.0–149.0)
Total CHOL/HDL Ratio: 5
VLDL: 32 mg/dL (ref 0.0–40.0)

## 2014-05-05 NOTE — Progress Notes (Signed)
[facility]. [address], [address] [place], [place]  [address] Phone: [phone] Fax:  [phone]  Date:  05/05/2014   ID:  Gregary, Blackard 12-Jan-1961, MRN [id number]  PCP:  Adella Hare, MD   History of Present Illness: Cameron Larsen is a 53 y.o. male here for the evaluation of chest pain. He was seen in the emergency department on 04/17/14 with complaints of pain in the epigastric region that was happening intermittently over the past several weeks. He is quite active, jogging and really does not notice any exertional chest discomfort. Occasionally he does notice pain after fatty meals. Lower sternal, feels like burning, stress in family, ?GERD.   Grandfather died recently and was 57 year old. He had bypass at age 55. His friend had similar symptoms and was told that he had a heart attack. He is anxious. No bleeding, no diarrhea, no vomiting.  Following the emergency room visit on 04/25/14 he saw Dr. Deatra Ina with gastroenterology. His symptoms resolved with daily Prilosec prescribed in the ER. Labs were normal. Abdominal exam benign. No further workup. He was still recommended to see cardiology.   Wt Readings from Last 3 Encounters:  05/05/14 167 lb (75.751 kg)  04/24/14 166 lb (75.297 kg)  04/17/14 155 lb (70.308 kg)     Past Medical History  Diagnosis Date  . Hyperlipidemia     Past Surgical History  Procedure Laterality Date  . Dental surgery      Current Outpatient Prescriptions  Medication Sig Dispense Refill  . acidophilus (RISAQUAD) CAPS capsule Take 1 capsule by mouth daily. Pro biotic      . omeprazole (PRILOSEC) 20 MG capsule Take 1 capsule (20 mg total) by mouth daily.  30 capsule  3   No current facility-administered medications for this visit.    Allergies:    Allergies  Allergen Reactions  . Cefuroxime Axetil   . Ciprofloxacin     fever    Social History:  The patient  reports that he has never smoked. He has never used smokeless tobacco.  He reports that he drinks alcohol. He reports that he does not use illicit drugs.   Family History  Problem Relation Age of Onset  . Breast cancer Mother   . Coronary artery disease Father     CABG 2008  . Hyperlipidemia Father   . Coronary artery disease Other   . Hearing loss Other     ROS:  Please see the history of present illness.   Denies any syncope, bleeding, orthopnea, PND, melena, rash.   All other systems reviewed and negative.   PHYSICAL EXAM: VS:  BP 140/88  Pulse 70  Ht 5\' 7"  (1.702 m)  Wt 167 lb (75.751 kg)  BMI 26.15 kg/m2 Well nourished, well developed, in no acute distress HEENT: normal, /AT, EOMI Neck: no JVD, normal carotid upstroke, no bruit Cardiac:  normal S1, S2; RRR; no murmur Lungs:  clear to auscultation bilaterally, no wheezing, rhonchi or rales Abd: soft, nontender, no hepatomegaly, no bruits Ext: no edema, 2+ distal pulses Skin: warm and dry GU: deferred Neuro: no focal abnormalities noted, AAO x 3  EKG:  04/17/14-sinus rhythm, no other abnormalities  Chest x-ray: 04/17/14-personally viewed, no abnormalities Labs-04/17/14 -troponin normal, ALT 32, creatinine 0.86, hemoglobin 15.2, LDL 176 in 2011   ASSESSMENT AND PLAN:  1. Chest pain-reassuring troponin, EKG, chest x-ray. Recent GI office visit. I do not think that it is unreasonable to proceed with stress testing  for further risk evaluation, assessment of ischemia. 2. Family history of CAD-hyperlipidemia-I will check a lipid panel. In the past, his LDL was 176 in 2011. He has been trying diet and exercise. Dr. Crissie Sickles was his previous primary physician, now retired. He may be proceeding with Dr. Damita Dunnings. If his LDL remains this elevated, with his family history, I will recommend statin therapy. We discussed the benefits. He is worried about potential side effects. 3. Will followup with stress test. If abnormal or if cholesterol elevated, we may go ahead and start statin therapy and may have  followup in that situation.  Signed, Candee Furbish, MD [facility]  05/05/2014 2:16 PM

## 2014-05-05 NOTE — Patient Instructions (Signed)
Schedule Treadmill   Follow instructions given    Lab Today   ( lipid panel )    Follow up as needed

## 2014-05-09 ENCOUNTER — Other Ambulatory Visit: Payer: Self-pay | Admitting: *Deleted

## 2014-05-09 DIAGNOSIS — E7800 Pure hypercholesterolemia, unspecified: Secondary | ICD-10-CM

## 2014-05-09 MED ORDER — ATORVASTATIN CALCIUM 20 MG PO TABS: 20.0000 mg | ORAL_TABLET | Freq: Every day | ORAL | Status: DC

## 2014-05-19 ENCOUNTER — Encounter: Payer: Self-pay | Admitting: Gastroenterology

## 2014-05-23 ENCOUNTER — Ambulatory Visit (AMBULATORY_SURGERY_CENTER): Payer: PRIVATE HEALTH INSURANCE | Admitting: Gastroenterology

## 2014-05-23 ENCOUNTER — Encounter: Payer: Self-pay | Admitting: Gastroenterology

## 2014-05-23 VITALS — BP 112/82 | HR 59 | Temp 97.8°F | Resp 16 | Ht 67.0 in | Wt 166.0 lb

## 2014-05-23 DIAGNOSIS — D129 Benign neoplasm of anus and anal canal: Secondary | ICD-10-CM

## 2014-05-23 DIAGNOSIS — D128 Benign neoplasm of rectum: Secondary | ICD-10-CM

## 2014-05-23 DIAGNOSIS — D126 Benign neoplasm of colon, unspecified: Secondary | ICD-10-CM

## 2014-05-23 DIAGNOSIS — Z1211 Encounter for screening for malignant neoplasm of colon: Secondary | ICD-10-CM

## 2014-05-23 MED ORDER — SODIUM CHLORIDE 0.9 % IV SOLN: 500.0000 mL | INTRAVENOUS | Status: DC

## 2014-05-23 NOTE — Patient Instructions (Signed)
YOU HAD AN ENDOSCOPIC PROCEDURE TODAY AT THE [facility]: Refer to the procedure report that was given to you for any specific questions about what was found during the examination.  If the procedure report does not answer your questions, please call your gastroenterologist to clarify.  If you requested that your care partner not be given the details of your procedure findings, then the procedure report has been included in a sealed envelope for you to review at your convenience later.  YOU SHOULD EXPECT: Some feelings of bloating in the abdomen. Passage of more gas than usual.  Walking can help get rid of the air that was put into your GI tract during the procedure and reduce the bloating. If you had a lower endoscopy (such as a colonoscopy or flexible sigmoidoscopy) you may notice spotting of blood in your stool or on the toilet paper. If you underwent a bowel prep for your procedure, then you may not have a normal bowel movement for a few days.  DIET: Your first meal following the procedure should be a light meal and then it is ok to progress to your normal diet.  A half-sandwich or bowl of soup is an example of a good first meal.  Heavy or fried foods are harder to digest and may make you feel nauseous or bloated.  Likewise meals heavy in dairy and vegetables can cause extra gas to form and this can also increase the bloating.  Drink plenty of fluids but you should avoid alcoholic beverages for 24 hours.  ACTIVITY: Your care partner should take you home directly after the procedure.  You should plan to take it easy, moving slowly for the rest of the day.  You can resume normal activity the day after the procedure however you should NOT DRIVE or use heavy machinery for 24 hours (because of the sedation medicines used during the test).    SYMPTOMS TO REPORT IMMEDIATELY: A gastroenterologist can be reached at any hour.  During normal business hours, 8:30 AM to 5:00 PM Monday through Friday,  call [phone].  After hours and on weekends, please call the GI answering service at [phone] who will take a message and have the physician on call contact you.   Following lower endoscopy (colonoscopy or flexible sigmoidoscopy):  Excessive amounts of blood in the stool  Significant tenderness or worsening of abdominal pains  Swelling of the abdomen that is new, acute  Fever of 100F or higher  FOLLOW UP: If any biopsies were taken you will be contacted by phone or by letter within the next 1-3 weeks.  Call your gastroenterologist if you have not heard about the biopsies in 3 weeks.  Our staff will call the home number listed on your records the next business day following your procedure to check on you and address any questions or concerns that you may have at that time regarding the information given to you following your procedure. This is a courtesy call and so if there is no answer at the home number and we have not heard from you through the emergency physician on call, we will assume that you have returned to your regular daily activities without incident.  SIGNATURES/CONFIDENTIALITY: You and/or your care partner have signed paperwork which will be entered into your electronic medical record.  These signatures attest to the fact that that the information above on your After Visit Summary has been reviewed and is understood.  Full responsibility of the confidentiality of this   discharge information lies with you and/or your care-partner.  Recommendations If the polyp removed today is an adenomatous polyp (pre-cancerous) polyp, you will need a repeat colonoscopy in 3 years. Otherwise continue to follow current guidelines for "routine risk" patients with a repeat colonoscopy in 10 years.

## 2014-05-23 NOTE — Progress Notes (Signed)
Called to room to assist during endoscopic procedure.  Patient ID and intended procedure confirmed with present staff. Received instructions for my participation in the procedure from the performing physician.  

## 2014-05-23 NOTE — Progress Notes (Signed)
Report to PACU, RN, vss, BBS= Clear.  

## 2014-05-23 NOTE — Op Note (Signed)
[facility]  [address] [place], [id number]   COLONOSCOPY PROCEDURE REPORT  PATIENT: Cameron Larsen, Cameron Larsen  MR#: [id number] BIRTHDATE: [birth year] , 53  yrs. old GENDER: Male ENDOSCOPIST: Inda Castle, MD REFERRED BY: PROCEDURE DATE:  05/23/2014 PROCEDURE:   Colonoscopy with snare polypectomy and Submucosal injection, any substance First Screening Colonoscopy - Avg.  risk and is 50 yrs.  old or older - No.  Prior Negative Screening - Now for repeat screening. 10 or more years since last screening  History of Adenoma - Now for follow-up colonoscopy & has been > or = to 3 yrs.  N/A  Polyps Removed Today? Yes. ASA CLASS:   Class II INDICATIONS:Average risk patient for colon cancer.  personal history of left-sided colitis 2003 MEDICATIONS: MAC sedation, administered by CRNA and Propofol (Diprivan) 320 mg IV  DESCRIPTION OF PROCEDURE:   After the risks benefits and alternatives of the procedure were thoroughly explained, informed consent was obtained.  A digital rectal exam revealed no abnormalities of the rectum.   The LB [id number]  endoscope was introduced through the anus and advanced to the cecum, which was identified by both the appendix and ileocecal valve. No adverse events experienced.   The quality of the prep was excellent, using Trilyte  The instrument was then slowly withdrawn as the colon was fully examined.      COLON FINDINGS: A smooth flat polyp measuring 2 cm in size was found in the ascending colon.  Endoscopic mucosal resection was performed by injecting 6cc saline into the submucosa to raise the lesion and polypectomy was performed with snare cautery.  The polyp lifted well after submucosal injection The polyp lifted well after submucosal injection.  The resection was complete and the polyp tissue was completely retrieved.   The colon was otherwise normal. There was no diverticulosis, inflammation, polyps or cancers  unless previously stated.  Retroflexed views revealed no abnormalities. The time to cecum=3 minutes 18 seconds.  Withdrawal time=12 minutes 38 seconds.  The scope was withdrawn and the procedure completed. COMPLICATIONS: There were no complications.  ENDOSCOPIC IMPRESSION: 1.   Flat polyp measuring 2 cm in size was found in the ascending colon; endoscopic mucosal resection was performed 2.   The colon was otherwise normal  RECOMMENDATIONS: If the polyp(s) removed today are proven to be adenomatous (pre-cancerous) polyps, you will need a colonoscopy in 3 years. Otherwise you should continue to follow colorectal cancer screening guidelines for "routine risk" patients with a colonoscopy in 10 years.  You will receive a letter within 1-2 weeks with the results of your biopsy as well as final recommendations.  Please call my office if you have not received a letter after 3 weeks.   eSigned:  Inda Castle, MD 05/23/2014 3:53 PM   cc: Neena Rhymes, MD and Marylynn Pearson MD   PATIENT NAME:  Cameron Larsen, Cameron Larsen MR#: [id number]

## 2014-05-24 ENCOUNTER — Telehealth: Payer: Self-pay

## 2014-05-24 NOTE — Telephone Encounter (Signed)
  Follow up Call-  Call back number 05/23/2014  Post procedure Call Back phone  # [phone]  Permission to leave phone message Yes     Patient questions:  Do you have a fever, pain , or abdominal swelling? no Pain Score  0 *  Have you tolerated food without any problems? yes  Have you been able to return to your normal activities? yes  Do you have any questions about your discharge instructions: Diet   no Medications  no Follow up visit  no  Do you have questions or concerns about your Care? no  Actions: * If pain score is 4 or above: No action needed, pain <4.

## 2014-05-31 ENCOUNTER — Encounter: Payer: Self-pay | Admitting: Gastroenterology

## 2014-06-13 ENCOUNTER — Telehealth (HOSPITAL_COMMUNITY): Payer: Self-pay

## 2014-06-13 NOTE — Telephone Encounter (Signed)
Encounter complete. 

## 2014-06-14 ENCOUNTER — Telehealth (HOSPITAL_COMMUNITY): Payer: Self-pay

## 2014-06-14 NOTE — Telephone Encounter (Signed)
Encounter complete. 

## 2014-06-15 ENCOUNTER — Ambulatory Visit (HOSPITAL_COMMUNITY)
Admission: RE | Admit: 2014-06-15 | Discharge: 2014-06-15 | Disposition: A | Discharge status: Home / Self Care | Payer: PRIVATE HEALTH INSURANCE | Source: Ambulatory Visit | Attending: Cardiology | Admitting: Cardiology

## 2014-06-15 DIAGNOSIS — E785 Hyperlipidemia, unspecified: Secondary | ICD-10-CM

## 2014-06-15 DIAGNOSIS — R079 Chest pain, unspecified: Secondary | ICD-10-CM

## 2014-06-15 NOTE — Procedures (Signed)
Exercise Treadmill Test    Test  Exercise Tolerance Test Ordering MD: Candee Furbish  I:   Unique Test No: 1   Treadmill:  1  Indication for ETT: chest pain - rule out ischemia  Contraindication to ETT: No   Stress Modality: exercise - treadmill  Cardiac Imaging Performed: non   Protocol: standard Bruce - maximal  Max BP:  152/113  Max MPHR (bpm):  169 85% MPR (bpm):  144  MPHR obtained (bpm):  176 % MPHR obtained:  104  Reached 85% MPHR (min:sec):  9:20 Total Exercise Time (min-sec):  11  Workload in METS:  13.4 Borg Scale: 15  Reason ETT Terminated:  ST depression >2.48mm    ST Segment Analysis At Rest: NSR, no ST changes With Exercise: no evidence of significant ST depression (nondiagnostic upsloping ST depression in inferolateral leads with peak exercise).  Other Information Arrhythmia:  No Angina during ETT:  absent (0) Quality of ETT:  diagnostic  ETT Interpretation:  normal - no evidence of ischemia by ST analysis  Comments: ETT with good exercise tolerance; normal BP response; no chest pain; nondiagnostic upsloping ST depression; negative adequate ETT.

## 2014-07-07 ENCOUNTER — Other Ambulatory Visit: Payer: Self-pay | Admitting: Gastroenterology

## 2014-07-11 ENCOUNTER — Other Ambulatory Visit (INDEPENDENT_AMBULATORY_CARE_PROVIDER_SITE_OTHER): Payer: PRIVATE HEALTH INSURANCE

## 2014-07-11 DIAGNOSIS — E7800 Pure hypercholesterolemia, unspecified: Secondary | ICD-10-CM

## 2014-07-11 LAB — LIPID PANEL
CHOLESTEROL: 220 mg/dL — AB (ref 0–200)
HDL: 49.2 mg/dL (ref >39.00)
LDL Cholesterol: 148 mg/dL — ABNORMAL HIGH (ref 0–99)
NonHDL: 170.8
TRIGLYCERIDES: 115 mg/dL (ref 0.0–149.0)
Total CHOL/HDL Ratio: 4
VLDL: 23 mg/dL (ref 0.0–40.0)

## 2014-07-11 LAB — ALT: ALT: 27 U/L (ref 0–53)

## 2014-07-19 ENCOUNTER — Telehealth: Payer: Self-pay | Admitting: Cardiology

## 2014-07-19 NOTE — Telephone Encounter (Signed)
Reviewed results with pt = please see the lab results for further documentation

## 2014-07-19 NOTE — Telephone Encounter (Signed)
°  Patient is returning your call. Please call back to [phone].

## 2014-07-21 ENCOUNTER — Telehealth: Payer: Self-pay | Admitting: Cardiology

## 2014-07-21 DIAGNOSIS — E782 Mixed hyperlipidemia: Secondary | ICD-10-CM

## 2014-07-21 NOTE — Telephone Encounter (Signed)
Pt aware lipid results and is agreeable to referral to [facility]

## 2014-07-21 NOTE — Telephone Encounter (Signed)
New message ° ° ° ° ° °Returning [name]'s call °

## 2014-07-25 ENCOUNTER — Ambulatory Visit (INDEPENDENT_AMBULATORY_CARE_PROVIDER_SITE_OTHER): Payer: PRIVATE HEALTH INSURANCE | Admitting: Pharmacist

## 2014-07-25 VITALS — Wt 167.0 lb

## 2014-07-25 DIAGNOSIS — Z79899 Other long term (current) drug therapy: Secondary | ICD-10-CM

## 2014-07-25 DIAGNOSIS — E785 Hyperlipidemia, unspecified: Secondary | ICD-10-CM

## 2014-07-25 MED ORDER — PITAVASTATIN CALCIUM 2 MG PO TABS: 2.0000 mg | ORAL_TABLET | Freq: Every day | ORAL | Status: DC

## 2014-07-25 NOTE — Patient Instructions (Signed)
1.  Stop red yeast rice 2.  Continue Co-Q 10 300 mg once daily. 3.  Start Livalo at dose of 2 mg once daily.  Take this in the evening time. 4.  Recheck cholesterol in 3 months (10/09/14 - fasting labs, anytime after 7:30 am), and see Ysidro Evert 1 day later to discuss (10/10/14 at 10:00 am)

## 2014-07-25 NOTE — Progress Notes (Signed)
Patient is a 53 y.o. WM referred to lipid clinic by Dr. Marlou Porch given LDL of 173 mg/dL and family h/o CAD (father).  Patient started on lipitor 20 mg qd recently, but after two weeks had to stop due to muscle and joint aches.  He had a stress treadmill test 06/2014 which was normal.  He started himself on Red Yeast Rice and Co-Q 10, but states he thinks he is still feeling some aches.  I explained to him that Red Yeast Rice typically has low dose of lovastatin in it, and in some circumstances has no lipid lowering ingredients in it at all (herbal supplement).  Patient's father had a CABG in his 60's, and mother had no h/o CAD.  He is an only child.  He tries to eat a relatively low fat diet, however he admits to drinking 7-8 beers most weeks.  Patient's father is on lipitor currently and has trouble getting out of bed and walking.  This may be due some underlying arthritis or back issues, however patient is scared of a statin causing him to get to this place.  We discussed high potency statins, and better tolerated statin options as well today.  He is interested in trying Livalo or Pravachol over Zocor or Lipitor, and given primary prevention at this time, I think this is reasonable.  Patient's cholesterol did improve on red yeast rice, so he is likely on a product with lovastatin in it.  This would also explain why he is getting some aches from product.  LDL goal < 100 mg/dL preferable Meds:  Red Yeast Rice 1200 mg daily, Co-Q 10 300 mg qd Intolerant:  Lipitor 20 mg qd  Labs:  TC 220, TG 115, HDL 49, LDL 148, non-HDL 171, LFTs normal (red yeast rice 1200 mg qd)  Current Outpatient Prescriptions  Medication Sig Dispense Refill  . Co-Enzyme Q-10 100 MG CAPS Take 300 mg by mouth daily.      . Red Yeast Rice 600 MG CAPS Take 600 mg by mouth 2 (two) times daily.      Marland Kitchen acidophilus (RISAQUAD) CAPS capsule Take 1 capsule by mouth daily. Pro biotic      . omeprazole (PRILOSEC) 20 MG capsule TAKE (1) CAPSULE  DAILY.  30 capsule  2   No current facility-administered medications for this visit.   Allergies  Allergen Reactions  . Cefuroxime Axetil   . Ciprofloxacin     fever  . Lipitor [Atorvastatin]     Muscle aches on 20 mg qd   Family History  Problem Relation Age of Onset  . Breast cancer Mother   . Coronary artery disease Father     CABG 2008  . Hyperlipidemia Father   . Coronary artery disease Other   . Hearing loss Other

## 2014-07-25 NOTE — Assessment & Plan Note (Signed)
Patient and I had long discussion about statin therapy and options.  We will have him start on Livalo 2 mg qd since it has moderate potency, and well tolerated.  If he can't tolerate this, he will try to cut down to Livalo 1 mg qd (1/2 pill).  Pravastatin could be option if Livalo 1 mg qd not tolerated, and he understands this.  Will continue Co-Q 10 for now, and will recheck lipid/liver in 3 months, and see me the following day.  He will call if he has problems.

## 2014-10-09 ENCOUNTER — Other Ambulatory Visit: Payer: PRIVATE HEALTH INSURANCE

## 2014-10-10 ENCOUNTER — Ambulatory Visit: Payer: PRIVATE HEALTH INSURANCE | Admitting: Pharmacist

## 2014-10-24 ENCOUNTER — Ambulatory Visit: Payer: PRIVATE HEALTH INSURANCE | Admitting: Pharmacist

## 2014-11-23 ENCOUNTER — Other Ambulatory Visit (INDEPENDENT_AMBULATORY_CARE_PROVIDER_SITE_OTHER): Payer: PRIVATE HEALTH INSURANCE | Admitting: *Deleted

## 2014-11-23 DIAGNOSIS — E785 Hyperlipidemia, unspecified: Secondary | ICD-10-CM

## 2014-11-23 DIAGNOSIS — Z79899 Other long term (current) drug therapy: Secondary | ICD-10-CM

## 2014-11-23 LAB — LIPID PANEL
CHOL/HDL RATIO: 6
Cholesterol: 273 mg/dL — ABNORMAL HIGH (ref 0–200)
HDL: 45.2 mg/dL (ref >39.00)
LDL Cholesterol: 204 mg/dL — ABNORMAL HIGH (ref 0–99)
NONHDL: 227.8
TRIGLYCERIDES: 121 mg/dL (ref 0.0–149.0)
VLDL: 24.2 mg/dL (ref 0.0–40.0)

## 2014-11-23 LAB — HEPATIC FUNCTION PANEL
ALBUMIN: 4.4 g/dL (ref 3.5–5.2)
ALT: 47 U/L (ref 0–53)
AST: 28 U/L (ref 0–37)
Alkaline Phosphatase: 46 U/L (ref 39–117)
Bilirubin, Direct: 0 mg/dL (ref 0.0–0.3)
Total Bilirubin: 0.7 mg/dL (ref 0.2–1.2)
Total Protein: 7.4 g/dL (ref 6.0–8.3)

## 2014-11-24 ENCOUNTER — Ambulatory Visit (INDEPENDENT_AMBULATORY_CARE_PROVIDER_SITE_OTHER): Payer: PRIVATE HEALTH INSURANCE | Admitting: Pharmacist

## 2014-11-24 DIAGNOSIS — E785 Hyperlipidemia, unspecified: Secondary | ICD-10-CM

## 2014-11-24 MED ORDER — PITAVASTATIN CALCIUM 2 MG PO TABS: 2.0000 mg | ORAL_TABLET | Freq: Every day | ORAL | Status: DC

## 2014-11-24 NOTE — Patient Instructions (Signed)
Start Livalo 2mg  every day.  If you have any problems, call Gay Filler at [phone].  Plan to recheck labs in 4 months.

## 2014-11-24 NOTE — Assessment & Plan Note (Signed)
Pt's cholesterol much worse now that he is off Red Yeast Rice and only on cholestoff.  Given change in labs, suggest starting statin at this time. We discussed options including Livalo or 2-3 times per week Crestor or pravastatin.  He would like to try Livalo first.  Will start with 2mg  daily as previously planned and recheck labs in 4 months.

## 2014-11-24 NOTE — Progress Notes (Signed)
Patient is a 53 y.o. WM referred to lipid clinic by Dr. Marlou Porch given LDL of 173 mg/dL and family h/o CAD (father).  Patient started on lipitor 20 mg qd but after two weeks had to stop due to muscle and joint aches.  He had a stress treadmill test 06/2014 which was normal.  He started himself on Red Yeast Rice and Co-Q 10, but states he thinks he is still feeling some aches. He stopped this in August with plans to start Livalo but pt never started this.  Instead, he went to his nutritionalist who suggested Cholestoff.  He has now been on this about 6 weeks.    Patient's father had a CABG in his 45's, and mother had no h/o CAD.  He is an only child. Patient's father is on lipitor currently and has trouble getting out of bed and walking.  This may be due some underlying arthritis or back issues, however patient is scared of a statin causing him to get to this place.    He tries to eat a relatively low fat diet, however he admits to drinking 7-8 beers most weeks.     LDL goal < 100 mg/dL preferable Meds:  Cholestoff,  Co-Q 10 300 mg qd Intolerant:  Lipitor 20 mg qd  Labs:   11/2014: TC 273, TG 121, HDL 45, LDL 204, non-HDL 228, LFTs normal (Cholestoff)   07/2014: TC 220, TG 115, HDL 49, LDL 148, non-HDL 171, LFTs normal (red yeast rice 1200 mg qd)  Current Outpatient Prescriptions  Medication Sig Dispense Refill  . acidophilus (RISAQUAD) CAPS capsule Take 1 capsule by mouth daily. Pro biotic    . Co-Enzyme Q-10 100 MG CAPS Take 300 mg by mouth daily.    Marland Kitchen omeprazole (PRILOSEC) 20 MG capsule TAKE (1) CAPSULE DAILY. 30 capsule 2  . Pitavastatin Calcium (LIVALO) 2 MG TABS Take 1 tablet (2 mg total) by mouth daily. 30 tablet 5   No current facility-administered medications for this visit.   Allergies  Allergen Reactions  . Cefuroxime Axetil   . Ciprofloxacin     fever  . Lipitor [Atorvastatin]     Muscle aches on 20 mg qd   Family History  Problem Relation Age of Onset  . Breast cancer Mother    . Coronary artery disease Father     CABG 2008  . Hyperlipidemia Father   . Coronary artery disease Other   . Hearing loss Other

## 2014-11-29 ENCOUNTER — Other Ambulatory Visit: Payer: Self-pay | Admitting: *Deleted

## 2014-11-29 MED ORDER — PITAVASTATIN CALCIUM 2 MG PO TABS: 2.0000 mg | ORAL_TABLET | Freq: Every day | ORAL | Status: DC

## 2015-03-13 ENCOUNTER — Telehealth: Payer: Self-pay | Admitting: Cardiology

## 2015-03-13 DIAGNOSIS — E785 Hyperlipidemia, unspecified: Secondary | ICD-10-CM

## 2015-03-13 NOTE — Telephone Encounter (Signed)
Patient calling because he wanted to know if he should/could have a NMR lipoprotein and an APOB test.  Advised I will forward this to Elberta Leatherwood for further recommendations.  He is aware she is not in the office at this time and he will be called back.  He has an appt for lab 4/22 and with Gay Filler 4/26.

## 2015-03-13 NOTE — Telephone Encounter (Signed)
New problem    Pt need to speak to nurse concerning having some extra labs drawn per his nutritionist. Please call pt.

## 2015-03-16 NOTE — Telephone Encounter (Signed)
LMOM for pt.  Will add lipoprotein and ApoB to labs given his family history.

## 2015-03-30 ENCOUNTER — Other Ambulatory Visit (INDEPENDENT_AMBULATORY_CARE_PROVIDER_SITE_OTHER): Payer: PRIVATE HEALTH INSURANCE | Admitting: *Deleted

## 2015-03-30 DIAGNOSIS — E785 Hyperlipidemia, unspecified: Secondary | ICD-10-CM | POA: Diagnosis not present

## 2015-03-30 LAB — HEPATIC FUNCTION PANEL
ALT: 34 U/L (ref 0–53)
AST: 24 U/L (ref 0–37)
Albumin: 4.5 g/dL (ref 3.5–5.2)
Alkaline Phosphatase: 45 U/L (ref 39–117)
BILIRUBIN DIRECT: 0.1 mg/dL (ref 0.0–0.3)
TOTAL PROTEIN: 7.4 g/dL (ref 6.0–8.3)
Total Bilirubin: 0.6 mg/dL (ref 0.2–1.2)

## 2015-04-03 ENCOUNTER — Ambulatory Visit: Payer: PRIVATE HEALTH INSURANCE | Admitting: Pharmacist

## 2015-04-05 ENCOUNTER — Other Ambulatory Visit: Payer: Self-pay | Admitting: Pharmacist

## 2015-04-05 ENCOUNTER — Ambulatory Visit: Payer: PRIVATE HEALTH INSURANCE | Admitting: Pharmacist

## 2015-04-05 DIAGNOSIS — E785 Hyperlipidemia, unspecified: Secondary | ICD-10-CM

## 2015-04-06 ENCOUNTER — Other Ambulatory Visit (INDEPENDENT_AMBULATORY_CARE_PROVIDER_SITE_OTHER): Payer: PRIVATE HEALTH INSURANCE | Admitting: *Deleted

## 2015-04-06 DIAGNOSIS — E785 Hyperlipidemia, unspecified: Secondary | ICD-10-CM | POA: Diagnosis not present

## 2015-04-06 NOTE — Addendum Note (Signed)
Addended by: Eulis Foster on: 04/06/2015 08:30 AM   Modules accepted: Orders

## 2015-04-09 LAB — APOLIPOPROTEIN B: Apolipoprotein B: 96 mg/dL (ref 52–135)

## 2015-04-10 ENCOUNTER — Telehealth: Payer: Self-pay | Admitting: Pharmacist Clinician (PhC)/ Clinical Pharmacy Specialist

## 2015-04-10 LAB — NMR LIPOPROFILE WITH LIPIDS
Cholesterol, Total: 181 mg/dL (ref 100–199)
HDL Particle Number: 28.8 umol/L — ABNORMAL LOW (ref >=30.5)
HDL SIZE: 8.8 nm — AB (ref >=9.2)
HDL-C: 50 mg/dL (ref >39)
LDL CALC: 112 mg/dL — AB (ref 0–99)
LDL PARTICLE NUMBER: 1526 nmol/L — AB (ref <1000)
LDL SIZE: 20.6 nm (ref >=20.8)
LP-IR Score: 33 (ref <=45)
Large HDL-P: 3.9 umol/L — ABNORMAL LOW (ref >=4.8)
Large VLDL-P: <0.8 nmol/L (ref <=2.7)
Small LDL Particle Number: 855 nmol/L — ABNORMAL HIGH (ref <=527)
Triglycerides: 93 mg/dL (ref 0–149)
VLDL Size: 36.3 nm (ref <=46.6)

## 2015-04-10 NOTE — Telephone Encounter (Signed)
Pt called wanting results of NMR.  He is seeing nutritionist today and they wanted this information.  Reviewed results with patient, explaining each number and its meaning.  Pt happy with decrease in LDL since starting Livalo 2 mg.  Concerned though about price of medication.  Has to pay >$100 per month, even with mfr discount card.  Is going to call his mail order pharmacy to see if it would be cheaper that way.  Gave samples for 6 weeks today.  Pt was originally scheduled to see Gay Filler last week, had to cancel due to problems with getting lab results.  Will have Gay Filler review to determine if she needs to see him in office.  Suggested we may be able to switch to generic Crestor in the future if cost continues to be a problem.

## 2015-12-09 HISTORY — PX: CERVICAL FUSION: SHX112

## 2016-07-04 ENCOUNTER — Ambulatory Visit (INDEPENDENT_AMBULATORY_CARE_PROVIDER_SITE_OTHER): Payer: PRIVATE HEALTH INSURANCE | Admitting: Family Medicine

## 2016-07-04 ENCOUNTER — Encounter: Payer: Self-pay | Admitting: Family Medicine

## 2016-07-04 ENCOUNTER — Ambulatory Visit: Payer: PRIVATE HEALTH INSURANCE | Admitting: Family Medicine

## 2016-07-04 VITALS — BP 132/88 | HR 105 | Temp 98.5°F | Ht 67.0 in | Wt 166.0 lb

## 2016-07-04 DIAGNOSIS — R202 Paresthesia of skin: Secondary | ICD-10-CM | POA: Diagnosis not present

## 2016-07-04 DIAGNOSIS — E785 Hyperlipidemia, unspecified: Secondary | ICD-10-CM

## 2016-07-04 NOTE — Patient Instructions (Signed)
Come back for fasting labs when off prednisone.  Schedule a lab appointment when convenient.  Take care.  Glad to see you.  Update me as needed.

## 2016-07-04 NOTE — Progress Notes (Signed)
Pre visit review using our clinic review tool, if applicable. No additional management support is needed unless otherwise documented below in the visit note.  HLD.  Prev on statin with aches.  Off med now.  Had seen Dr. Fredirick Maudlin.  He has worked on diet and exercise.  D/w pt about recheck labs when fasting/feasible.   L arm paresthesia and pain for about 2 weeks with MRI done per Dr. Drema Dallas.  On meds listed with some improvement.  Has used a neck collar recently with some relief.  He has f/u re: MRI pending with Dr. Drema Dallas.    Had elevated BP at ortho office.  No CP, SOB, BLE edema.  No exercise intolerance.    [facility].  D/w pt about routine screening.    PMH and SH reviewed  ROS: Per HPI unless specifically indicated in ROS section   Meds, vitals, and allergies reviewed.   GEN: nad, alert and oriented HEENT: mucous membranes moist NECK: supple w/o LA CV: rrr.  PULM: ctab, no inc wob ABD: soft, +bs EXT: no edema SKIN: no acute rash

## 2016-07-06 ENCOUNTER — Encounter: Payer: Self-pay | Admitting: Family Medicine

## 2016-07-06 DIAGNOSIS — R202 Paresthesia of skin: Secondary | ICD-10-CM | POA: Insufficient documentation

## 2016-07-06 NOTE — Assessment & Plan Note (Signed)
Will return for labs.  D/w pt.  He agrees. >30 minutes spent in face to face time with patient, >50% spent in counselling or coordination of care.

## 2016-07-06 NOTE — Assessment & Plan Note (Signed)
Per Dr. Drema Dallas, no weakness on exam of BUE today at [facility].

## 2016-08-04 ENCOUNTER — Telehealth: Payer: Self-pay | Admitting: *Deleted

## 2016-08-04 NOTE — Telephone Encounter (Signed)
I'll check the hard copy. Thanks.  

## 2016-08-04 NOTE — Telephone Encounter (Signed)
PT came in to have pre-op form filled out. Informed him he may have to have an appointment before the form may be filled out. Please contact him [phone]. Form placed in prescription tower.

## 2016-08-04 NOTE — Telephone Encounter (Signed)
Form placed in Dr. Josefine Class office.

## 2016-08-07 NOTE — Telephone Encounter (Signed)
Patient should be appropriately low risk for surgery.  Thanks.  Form done.

## 2016-08-07 NOTE — Telephone Encounter (Signed)
Faxed form to [employer] and scanned.

## 2016-09-25 ENCOUNTER — Encounter: Payer: Self-pay | Admitting: Family Medicine

## 2017-03-24 ENCOUNTER — Encounter: Payer: Self-pay | Admitting: Gastroenterology

## 2017-05-14 ENCOUNTER — Encounter: Payer: Self-pay | Admitting: Gastroenterology

## 2017-07-07 ENCOUNTER — Telehealth: Payer: Self-pay | Admitting: Family Medicine

## 2017-07-07 NOTE — Telephone Encounter (Signed)
Pt has labs on 07/08/17 and cpe on 07/10/17. Please put in labs for regular cpe labs. Additionally, pt is requesting repeat labs for NMR and APO-B. He states he had these labs about 2 years ago, and would like them repeated. Thanks

## 2017-07-07 NOTE — Telephone Encounter (Signed)
Cameron Ghent, MD  [name]        No. It won't change the plan. We can talk at [place] but I wouldn't do it.   Brigitte Pulse

## 2017-07-08 ENCOUNTER — Other Ambulatory Visit: Payer: Self-pay | Admitting: *Deleted

## 2017-07-08 ENCOUNTER — Other Ambulatory Visit (INDEPENDENT_AMBULATORY_CARE_PROVIDER_SITE_OTHER): Payer: PRIVATE HEALTH INSURANCE

## 2017-07-08 DIAGNOSIS — E785 Hyperlipidemia, unspecified: Secondary | ICD-10-CM

## 2017-07-08 LAB — COMPREHENSIVE METABOLIC PANEL
ALBUMIN: 4.4 g/dL (ref 3.5–5.2)
ALK PHOS: 50 U/L (ref 39–117)
ALT: 43 U/L (ref 0–53)
AST: 24 U/L (ref 0–37)
BUN: 17 mg/dL (ref 6–23)
CALCIUM: 9.6 mg/dL (ref 8.4–10.5)
CHLORIDE: 105 meq/L (ref 96–112)
CO2: 28 mEq/L (ref 19–32)
Creatinine, Ser: 1.16 mg/dL (ref 0.40–1.50)
GFR: 69.66 mL/min (ref >60.00)
Glucose, Bld: 108 mg/dL — ABNORMAL HIGH (ref 70–99)
POTASSIUM: 4.7 meq/L (ref 3.5–5.1)
SODIUM: 140 meq/L (ref 135–145)
Total Bilirubin: 0.6 mg/dL (ref 0.2–1.2)
Total Protein: 7.2 g/dL (ref 6.0–8.3)

## 2017-07-08 LAB — LIPID PANEL
CHOLESTEROL: 269 mg/dL — AB (ref 0–200)
HDL: 49.5 mg/dL (ref >39.00)
LDL CALC: 200 mg/dL — AB (ref 0–99)
NonHDL: 219.49
Total CHOL/HDL Ratio: 5
Triglycerides: 99 mg/dL (ref 0.0–149.0)
VLDL: 19.8 mg/dL (ref 0.0–40.0)

## 2017-07-10 ENCOUNTER — Encounter: Payer: Self-pay | Admitting: Family Medicine

## 2017-07-10 ENCOUNTER — Ambulatory Visit (INDEPENDENT_AMBULATORY_CARE_PROVIDER_SITE_OTHER): Payer: PRIVATE HEALTH INSURANCE | Admitting: Family Medicine

## 2017-07-10 VITALS — BP 122/78 | HR 91 | Temp 97.9°F | Ht 67.0 in | Wt 167.2 lb

## 2017-07-10 DIAGNOSIS — E785 Hyperlipidemia, unspecified: Secondary | ICD-10-CM

## 2017-07-10 DIAGNOSIS — Z7189 Other specified counseling: Secondary | ICD-10-CM | POA: Diagnosis not present

## 2017-07-10 DIAGNOSIS — R202 Paresthesia of skin: Secondary | ICD-10-CM | POA: Diagnosis not present

## 2017-07-10 DIAGNOSIS — R42 Dizziness and giddiness: Secondary | ICD-10-CM

## 2017-07-10 DIAGNOSIS — Z0001 Encounter for general adult medical examination with abnormal findings: Secondary | ICD-10-CM

## 2017-07-10 NOTE — Patient Instructions (Signed)
Let me consider some options and we'll be in touch.  If the balance symptoms continue then let me know.  Take care.  Glad to see you.  I would get a flu shot each fall.

## 2017-07-10 NOTE — Progress Notes (Signed)
CPE- See plan.  Routine anticipatory guidance given to patient.  See health maintenance.  The possibility exists that previously documented standard health maintenance information may have been brought forward from a previous encounter into this note.  If needed, that same information has been updated to reflect the current situation based on today's encounter.    Tetanus 2011 Flu done at work yearly PNA and shingles not due.  Colonoscopy pending 2018 Prostate cancer screening and PSA options (with potential risks and benefits of testing vs not testing) were discussed along with recent recs/guidelines.  He declined testing PSA at this point. Living will d/w pt.  Wife designated if patient were incapacitated.   Diet and exercise d/w pt.  He is working on both.    His neck and L arm sx are better, no pain but still with some L hand paresthesia slowly getting better.    He had some dysequilibrium/off balance sx (tilting to the right) episodically recently but he has no true vertigo sx, no room spinning. Some HA recently, on the top of the head, a pressure, with the sx.  No presyncope.  Intermittent sx.  [place].  Not lightheaded.  No sx today.  He didn't know if he was anxious about the drive ahead of time and if that affected him. He had trouble driving near [place] years ago and that was similar.   HLD.  He had less aches with livalo vs lipitor but still had aches with both.    PMH and SH reviewed  Meds, vitals, and allergies reviewed.   ROS: Per HPI.  Unless specifically indicated otherwise in HPI, the patient denies:  General: fever. Eyes: acute vision changes ENT: sore throat Cardiovascular: chest pain Respiratory: SOB GI: vomiting GU: dysuria Musculoskeletal: acute back pain Derm: acute rash Neuro: acute motor dysfunction Psych: worsening mood Endocrine: polydipsia Heme: bleeding Allergy: hayfever  GEN: nad, alert and  oriented HEENT: mucous membranes moist NECK: supple w/o LA CV: rrr. PULM: ctab, no inc wob ABD: soft, +bs EXT: no edema SKIN: no acute rash CN 2-12 wnl B, S/S/DTR wnl x4

## 2017-07-12 DIAGNOSIS — R42 Dizziness and giddiness: Secondary | ICD-10-CM | POA: Insufficient documentation

## 2017-07-12 DIAGNOSIS — Z7189 Other specified counseling: Secondary | ICD-10-CM | POA: Insufficient documentation

## 2017-07-12 DIAGNOSIS — Z0000 Encounter for general adult medical examination without abnormal findings: Secondary | ICD-10-CM | POA: Insufficient documentation

## 2017-07-12 DIAGNOSIS — Z0001 Encounter for general adult medical examination with abnormal findings: Secondary | ICD-10-CM | POA: Insufficient documentation

## 2017-07-12 NOTE — Assessment & Plan Note (Signed)
Improved, I think this is all separate from the recent symptoms he had driving to the mountains.

## 2017-07-12 NOTE — Assessment & Plan Note (Signed)
Discussed with patient. He has a family history of heart disease and has an elevated LDL and mildly elevated sugar, in spite of normal weight and relatively healthy diet and exercise. It is unlikely that particle size analysis would changes the plan at this point. The question is what can he do with medicines that he can tolerate that would improve his risk profile. Discussed with patient. I want to consider. We will be in touch with patient. He agrees.

## 2017-07-12 NOTE — Assessment & Plan Note (Signed)
Tetanus 2011 Flu done at work yearly PNA and shingles not due.  Colonoscopy pending 2018 Prostate cancer screening and PSA options (with potential risks and benefits of testing vs not testing) were discussed along with recent recs/guidelines.  He declined testing PSA at this point. Living will d/w pt.  Wife designated if patient were incapacitated.   Diet and exercise d/w pt.  He is working on both.

## 2017-07-12 NOTE — Assessment & Plan Note (Signed)
Living will d/w pt.  Wife designated if patient were incapacitated.   ?

## 2017-07-12 NOTE — Assessment & Plan Note (Signed)
He had similar symptoms years ago when he was driving [place] [facility]. He had a return of symptoms when he was driving through the mountains recently. He has no symptoms today. He has a completely normal neurologic exam today. I question if he has some symptoms, the feeling of disequilibrium, when he is driving in abnormal situations, such as along a [place] facing.  I want him to monitor his symptoms for now and update me. He has no symptoms currently and no restriction to driving in the [place]. He agrees.

## 2017-07-17 ENCOUNTER — Ambulatory Visit (AMBULATORY_SURGERY_CENTER): Payer: Self-pay | Admitting: *Deleted

## 2017-07-17 VITALS — Ht 67.0 in | Wt 165.8 lb

## 2017-07-17 DIAGNOSIS — Z86010 Personal history of colonic polyps: Secondary | ICD-10-CM

## 2017-07-17 MED ORDER — NA SULFATE-K SULFATE-MG SULF 17.5-3.13-1.6 GM/177ML PO SOLN: 1.0000 [IU] | Freq: Once | ORAL | 0 refills | Status: AC

## 2017-07-17 NOTE — Progress Notes (Signed)
No egg or soy allergy known to patient  No issues with past sedation with any surgeries  or procedures, no intubation problems  No diet pills per patient No home 02 use per patient  No blood thinners per patient  Pt denies issues with constipation  No A fib or A flutter  EMMI video sent to pt's e mail  

## 2017-07-19 ENCOUNTER — Other Ambulatory Visit: Payer: Self-pay | Admitting: Family Medicine

## 2017-07-19 DIAGNOSIS — Z789 Other specified health status: Secondary | ICD-10-CM

## 2017-07-19 DIAGNOSIS — E785 Hyperlipidemia, unspecified: Secondary | ICD-10-CM

## 2017-07-19 NOTE — Telephone Encounter (Signed)
Notify patient. I talked with Dr. Marlou Porch about his situation.  Reasonable to check vitamin D level to make sure that is okay. I put in the order.  If this is low we may need to address that. Reasonable to try Crestor 5 mg once a week. See if he can tolerate that for about 1 month. If he can,  then try to increase to twice a week. If he can't tolerate it then let me know.  I put in the order for the medicine, to send it assuming he is willing to try.  Have him recheck his lipids in about 2 months, assuming he can tolerate the medication. I did not put in that order yet. Thanks.

## 2017-07-20 NOTE — Telephone Encounter (Signed)
Patient advised.  Lab appt scheduled to check vitamin D.  Patient wishes to think about the Crestor before having it sent in and labs scheduled in 2 months and will let us know.

## 2017-07-21 ENCOUNTER — Other Ambulatory Visit (INDEPENDENT_AMBULATORY_CARE_PROVIDER_SITE_OTHER): Payer: PRIVATE HEALTH INSURANCE

## 2017-07-21 DIAGNOSIS — E785 Hyperlipidemia, unspecified: Secondary | ICD-10-CM | POA: Diagnosis not present

## 2017-07-21 DIAGNOSIS — Z789 Other specified health status: Secondary | ICD-10-CM

## 2017-07-21 LAB — VITAMIN D 25 HYDROXY (VIT D DEFICIENCY, FRACTURES): VITD: 23.71 ng/mL — ABNORMAL LOW (ref 30.00–100.00)

## 2017-07-21 NOTE — Telephone Encounter (Signed)
Noted. Thanks.  Will await input from patient.  

## 2017-07-23 ENCOUNTER — Other Ambulatory Visit: Payer: Self-pay | Admitting: Family Medicine

## 2017-07-23 DIAGNOSIS — E559 Vitamin D deficiency, unspecified: Secondary | ICD-10-CM | POA: Insufficient documentation

## 2017-07-23 MED ORDER — VITAMIN D 50 MCG (2000 UT) PO CAPS: 2000.0000 [IU] | ORAL_CAPSULE | Freq: Every day | ORAL | Status: DC

## 2017-07-31 ENCOUNTER — Ambulatory Visit (AMBULATORY_SURGERY_CENTER): Payer: PRIVATE HEALTH INSURANCE | Admitting: Gastroenterology

## 2017-07-31 ENCOUNTER — Encounter: Payer: Self-pay | Admitting: Gastroenterology

## 2017-07-31 VITALS — BP 106/71 | HR 65 | Temp 98.0°F | Resp 11 | Ht 67.0 in | Wt 165.0 lb

## 2017-07-31 DIAGNOSIS — Z86010 Personal history of colonic polyps: Secondary | ICD-10-CM

## 2017-07-31 DIAGNOSIS — D127 Benign neoplasm of rectosigmoid junction: Secondary | ICD-10-CM

## 2017-07-31 DIAGNOSIS — D128 Benign neoplasm of rectum: Secondary | ICD-10-CM | POA: Diagnosis not present

## 2017-07-31 MED ORDER — SODIUM CHLORIDE 0.9 % IV SOLN: 500.0000 mL | INTRAVENOUS | Status: DC

## 2017-07-31 NOTE — Op Note (Signed)
[facility] Patient Name: Cameron Larsen Procedure Date: 07/31/2017 8:19 AM MRN: [id number] Endoscopist: Remo Lipps P. Armbruster MD, MD Age: 56 Referring MD:  Date of Birth: 1961/09/16 Gender: Male Account #: [id number] Procedure:                Colonoscopy Indications:              High risk colon cancer surveillance: Personal                            history of sessile serrated colon polyp (2cm in                            size) removed 3 years ago Medicines:                Monitored Anesthesia Care Procedure:                Pre-Anesthesia Assessment:                           - Prior to the procedure, a History and Physical                            was performed, and patient medications and                            allergies were reviewed. The patient's tolerance of                            previous anesthesia was also reviewed. The risks                            and benefits of the procedure and the sedation                            options and risks were discussed with the patient.                            All questions were answered, and informed consent                            was obtained. Prior Anticoagulants: The patient has                            taken no previous anticoagulant or antiplatelet                            agents. ASA Grade Assessment: II - A patient with                            mild systemic disease. After reviewing the risks                            and benefits, the patient was deemed in  satisfactory condition to undergo the procedure.                           After obtaining informed consent, the colonoscope                            was passed under direct vision. Throughout the                            procedure, the patient's blood pressure, pulse, and                            oxygen saturations were monitored continuously. The                            Colonoscope was introduced  through the anus and                            advanced to the the cecum, identified by                            appendiceal orifice and ileocecal valve. The                            colonoscopy was performed without difficulty. The                            patient tolerated the procedure well. The quality                            of the bowel preparation was good. The ileocecal                            valve, appendiceal orifice, and rectum were                            photographed. Scope In: 8:29:51 AM Scope Out: 8:46:50 AM Scope Withdrawal Time: 0 hours 14 minutes 32 seconds  Total Procedure Duration: 0 hours 16 minutes 59 seconds  Findings:                 The perianal and digital rectal examinations were                            normal.                           A 3 mm polyp was found in the recto-sigmoid colon.                            The polyp was sessile. The polyp was removed with a                            cold biopsy forceps. Resection and retrieval were  complete.                           Internal hemorrhoids were found during                            retroflexion. The hemorrhoids were small.                           The exam was otherwise without abnormality. Complications:            No immediate complications. Estimated blood loss:                            Minimal. Estimated Blood Loss:     Estimated blood loss was minimal. Impression:               - One 3 mm polyp at the recto-sigmoid colon,                            removed with a cold biopsy forceps. Resected and                            retrieved.                           - Internal hemorrhoids.                           - The examination was otherwise normal. Recommendation:           - Patient has a contact number available for                            emergencies. The signs and symptoms of potential                            delayed complications were  discussed with the                            patient. Return to normal activities tomorrow.                            Written discharge instructions were provided to the                            patient.                           - Resume previous diet.                           - Continue present medications.                           - Await pathology results.                           -  Repeat colonoscopy in 5 years for surveillance                            given large sessile serrated polyp removed 3 years                            ago [place]. Armbruster MD, MD 07/31/2017 8:49:46 AM This report has been signed electronically.

## 2017-07-31 NOTE — Progress Notes (Signed)
Pt's states no medical or surgical changes since previsit or office visit. 

## 2017-07-31 NOTE — Progress Notes (Signed)
To PACU VSS. Report to [email or web address] 

## 2017-07-31 NOTE — Patient Instructions (Signed)
YOU HAD AN ENDOSCOPIC PROCEDURE TODAY AT [facility]:   Refer to the procedure report that was given to you for any specific questions about what was found during the examination.  If the procedure report does not answer your questions, please call your gastroenterologist to clarify.  If you requested that your care partner not be given the details of your procedure findings, then the procedure report has been included in a sealed envelope for you to review at your convenience later.  YOU SHOULD EXPECT: Some feelings of bloating in the abdomen. Passage of more gas than usual.  Walking can help get rid of the air that was put into your GI tract during the procedure and reduce the bloating. If you had a lower endoscopy (such as a colonoscopy or flexible sigmoidoscopy) you may notice spotting of blood in your stool or on the toilet paper. If you underwent a bowel prep for your procedure, you may not have a normal bowel movement for a few days.  Please Note:  You might notice some irritation and congestion in your nose or some drainage.  This is from the oxygen used during your procedure.  There is no need for concern and it should clear up in a day or so.  SYMPTOMS TO REPORT IMMEDIATELY:   Following lower endoscopy (colonoscopy or flexible sigmoidoscopy):  Excessive amounts of blood in the stool  Significant tenderness or worsening of abdominal pains  Swelling of the abdomen that is new, acute  Fever of 100F or higher   Following upper endoscopy (EGD)  Vomiting of blood or coffee ground material  New chest pain or pain under the shoulder blades  Painful or persistently difficult swallowing  New shortness of breath  Fever of 100F or higher  Black, tarry-looking stools  For urgent or emergent issues, a gastroenterologist can be reached at any hour by calling [phone].   DIET:  We do recommend a small meal at first, but then you may proceed to your regular diet.  Drink  plenty of fluids but you should avoid alcoholic beverages for 24 hours.  ACTIVITY:  You should plan to take it easy for the rest of today and you should NOT DRIVE or use heavy machinery until tomorrow (because of the sedation medicines used during the test).    FOLLOW UP: Our staff will call the number listed on your records the next business day following your procedure to check on you and address any questions or concerns that you may have regarding the information given to you following your procedure. If we do not reach you, we will leave a message.  However, if you are feeling well and you are not experiencing any problems, there is no need to return our call.  We will assume that you have returned to your regular daily activities without incident.  If any biopsies were taken you will be contacted by phone or by letter within the next 1-3 weeks.  Please call us at [phone] if you have not heard about the biopsies in 3 weeks.    SIGNATURES/CONFIDENTIALITY: You and/or your care partner have signed paperwork which will be entered into your electronic medical record.  These signatures attest to the fact that that the information above on your After Visit Summary has been reviewed and is understood.  Full responsibility of the confidentiality of this discharge information lies with you and/or your care-partner.     Information on polyps and hemorrhoids given to you  today  Await pathology report on polyp removed

## 2017-08-03 ENCOUNTER — Telehealth: Payer: Self-pay | Admitting: *Deleted

## 2017-08-03 NOTE — Telephone Encounter (Signed)
Left message on f/u call 

## 2017-08-03 NOTE — Telephone Encounter (Signed)
  Follow up Call-  Call back number 07/31/2017  Post procedure Call Back phone  # [phone]  Permission to leave phone message Yes  Some recent data might be hidden    [facility]

## 2017-08-05 ENCOUNTER — Encounter: Payer: Self-pay | Admitting: Gastroenterology

## 2017-10-20 ENCOUNTER — Other Ambulatory Visit (INDEPENDENT_AMBULATORY_CARE_PROVIDER_SITE_OTHER): Payer: PRIVATE HEALTH INSURANCE

## 2017-10-20 DIAGNOSIS — E559 Vitamin D deficiency, unspecified: Secondary | ICD-10-CM | POA: Diagnosis not present

## 2017-10-20 LAB — VITAMIN D 25 HYDROXY (VIT D DEFICIENCY, FRACTURES): VITD: 20.74 ng/mL — ABNORMAL LOW (ref 30.00–100.00)

## 2017-10-21 ENCOUNTER — Other Ambulatory Visit: Payer: Self-pay | Admitting: Family Medicine

## 2017-10-21 DIAGNOSIS — E559 Vitamin D deficiency, unspecified: Secondary | ICD-10-CM

## 2017-10-21 MED ORDER — VITAMIN D 50 MCG (2000 UT) PO CAPS: 4000.0000 [IU] | ORAL_CAPSULE | Freq: Every day | ORAL | Status: DC

## 2017-11-06 ENCOUNTER — Telehealth: Payer: Self-pay

## 2017-11-06 NOTE — Telephone Encounter (Signed)
Copied from [place] [id number]. Topic: General - Other >> Nov 06, 2017 11:14 AM Conception Chancy, NT wrote: Reason for CRM: pt would like Dr. Damita Dunnings  nurse to give him a call. Did not disclose any information with me >> Nov 06, 2017 11:23 AM Helene Shoe, LPN wrote: I spoke with pt; pt is going to have to fly to [facility] during the Christmas holiday; pt wants med for anxiety due to anxiety when flying. Pt took valium last yr when had MRI due to anxiety of close places. [facility]; pt last seen annual 07/10/17.

## 2017-11-08 MED ORDER — DIAZEPAM 5 MG PO TABS: 2.5000 mg | ORAL_TABLET | Freq: Every day | ORAL | 0 refills | Status: DC | PRN

## 2017-11-08 NOTE — Telephone Encounter (Signed)
Please call in rx.  Thanks.  

## 2017-11-09 NOTE — Telephone Encounter (Signed)
Medication phoned to pharmacy.  

## 2018-01-26 ENCOUNTER — Other Ambulatory Visit (INDEPENDENT_AMBULATORY_CARE_PROVIDER_SITE_OTHER): Payer: PRIVATE HEALTH INSURANCE

## 2018-01-26 DIAGNOSIS — E559 Vitamin D deficiency, unspecified: Secondary | ICD-10-CM | POA: Diagnosis not present

## 2018-01-26 LAB — VITAMIN D 25 HYDROXY (VIT D DEFICIENCY, FRACTURES): VITD: 37.4 ng/mL (ref 30.00–100.00)

## 2018-01-28 ENCOUNTER — Other Ambulatory Visit: Payer: Self-pay | Admitting: Family Medicine

## 2018-01-28 DIAGNOSIS — E559 Vitamin D deficiency, unspecified: Secondary | ICD-10-CM

## 2018-01-28 MED ORDER — VITAMIN D 50 MCG (2000 UT) PO CAPS: 2000.0000 [IU] | ORAL_CAPSULE | Freq: Every day | ORAL | Status: DC

## 2018-08-03 ENCOUNTER — Encounter: Payer: Self-pay | Admitting: Family Medicine

## 2018-08-03 ENCOUNTER — Ambulatory Visit (INDEPENDENT_AMBULATORY_CARE_PROVIDER_SITE_OTHER): Payer: PRIVATE HEALTH INSURANCE | Admitting: Family Medicine

## 2018-08-03 VITALS — BP 136/82 | HR 78 | Temp 98.4°F | Ht 67.0 in | Wt 165.2 lb

## 2018-08-03 DIAGNOSIS — R5383 Other fatigue: Secondary | ICD-10-CM

## 2018-08-03 DIAGNOSIS — R0789 Other chest pain: Secondary | ICD-10-CM | POA: Diagnosis not present

## 2018-08-03 LAB — CBC WITH DIFFERENTIAL/PLATELET
BASOS ABS: 0 10*3/uL (ref 0.0–0.1)
BASOS PCT: 0.4 % (ref 0.0–3.0)
EOS ABS: 0.2 10*3/uL (ref 0.0–0.7)
Eosinophils Relative: 2.7 % (ref 0.0–5.0)
HCT: 47.2 % (ref 39.0–52.0)
HEMOGLOBIN: 16 g/dL (ref 13.0–17.0)
LYMPHS PCT: 22.7 % (ref 12.0–46.0)
Lymphs Abs: 1.6 10*3/uL (ref 0.7–4.0)
MCHC: 33.9 g/dL (ref 30.0–36.0)
MCV: 87.3 fl (ref 78.0–100.0)
MONOS PCT: 9.9 % (ref 3.0–12.0)
Monocytes Absolute: 0.7 10*3/uL (ref 0.1–1.0)
NEUTROS ABS: 4.5 10*3/uL (ref 1.4–7.7)
Neutrophils Relative %: 64.3 % (ref 43.0–77.0)
PLATELETS: 186 10*3/uL (ref 150.0–400.0)
RBC: 5.4 Mil/uL (ref 4.22–5.81)
RDW: 13.8 % (ref 11.5–15.5)
WBC: 7 10*3/uL (ref 4.0–10.5)

## 2018-08-03 LAB — COMPREHENSIVE METABOLIC PANEL
ALBUMIN: 4.7 g/dL (ref 3.5–5.2)
ALT: 30 U/L (ref 0–53)
AST: 22 U/L (ref 0–37)
Alkaline Phosphatase: 50 U/L (ref 39–117)
BILIRUBIN TOTAL: 0.5 mg/dL (ref 0.2–1.2)
BUN: 19 mg/dL (ref 6–23)
CALCIUM: 10 mg/dL (ref 8.4–10.5)
CHLORIDE: 103 meq/L (ref 96–112)
CO2: 28 meq/L (ref 19–32)
Creatinine, Ser: 1.25 mg/dL (ref 0.40–1.50)
GFR: 63.66 mL/min (ref >60.00)
Glucose, Bld: 107 mg/dL — ABNORMAL HIGH (ref 70–99)
Potassium: 4.5 mEq/L (ref 3.5–5.1)
Sodium: 138 mEq/L (ref 135–145)
Total Protein: 7.7 g/dL (ref 6.0–8.3)

## 2018-08-03 LAB — TSH: TSH: 4.15 u[IU]/mL (ref 0.35–4.50)

## 2018-08-03 NOTE — Patient Instructions (Addendum)
We'll contact you with your lab report. Let me check with cardiology in the meantime.  Take care.  Glad to see you.

## 2018-08-03 NOTE — Progress Notes (Signed)
07/19/18.  He was having chest pain initially, at rest.  He thought he had heartburn.  The pain was moving up along his esophagus.  He started taking omeprazole for 14 days and the sx completely resolved- sig better after the first dose.    Fatigue in the last few weeks.  He thought he had a lot of stressors recently with work and that could have contributed.  The work situation is not yet better.  D/w pt.    Back in May he started going to the [facility] and has been running/working out.  In August his energy level decreased.    Today he feels fine.  He was able to jog on the treadmill last night.  Not SOB, not CP during exercise.  He was fatigued after being on the treadmill but was still able to exercise.  When he does get on a treadmill he puts it at the maximum incline and moves quickly enough to keep his heart rate around a target of 150.  He does this for about 30 minutes.  Meds, vitals, and allergies reviewed.   ROS: Per HPI unless specifically indicated in ROS section   GEN: nad, alert and oriented HEENT: mucous membranes moist NECK: supple w/o LA CV: rrr.  no murmur PULM: ctab, no inc wob ABD: soft, +bs EXT: no edema SKIN: no acute rash  EKG normal.  Discussed with patient.

## 2018-08-04 ENCOUNTER — Telehealth: Payer: Self-pay | Admitting: Cardiology

## 2018-08-04 DIAGNOSIS — R0789 Other chest pain: Secondary | ICD-10-CM | POA: Insufficient documentation

## 2018-08-04 NOTE — Telephone Encounter (Signed)
-----   Message from Tonia Ghent, MD sent at 08/04/2018 11:08 AM EDT ----- I sent you the office visit note.  Long story short, he wanted to know about calcium scoring/cardiac CT given his family history.  You saw him a few years ago.  He recently had some atypical chest pain that was likely reflux related.  I would like your input on calcium scoring versus other options you may suggest.  Thanks.  Take care.  Hope you are doing well.  Brigitte Pulse

## 2018-08-04 NOTE — Telephone Encounter (Signed)
Please have him come in to see me. CP.  Candee Furbish, MD

## 2018-08-04 NOTE — Assessment & Plan Note (Signed)
>  25 minutes spent in face to face time with patient, >50% spent in counselling or coordination of care  I suspect there are multiple issues to consider.  He likely had heartburn since his initial symptoms were at rest and they completely resolved taking omeprazole.  He is able to exercise as above without chest pain.  He has fatigue noted.  Reasonable to check routine labs but I suspect some of this is related to work stressors.  Discussed.  Family history of coronary disease.  He was asking about risk stratification for that.  He did not know if he would be a good candidate for calcium scoring with cardiac CT.  I will ask for cardiology input on this after I see his labs.

## 2018-08-05 NOTE — Telephone Encounter (Signed)
Left detailed message advising patient that he needs an appt for cardiac clearance. Advised patient that the appt has been made and for him to contact office if the appointment does not work for him.

## 2018-08-12 ENCOUNTER — Encounter: Payer: Self-pay | Admitting: Cardiology

## 2018-08-13 ENCOUNTER — Encounter: Payer: Self-pay | Admitting: Cardiology

## 2018-08-13 ENCOUNTER — Ambulatory Visit (INDEPENDENT_AMBULATORY_CARE_PROVIDER_SITE_OTHER): Payer: PRIVATE HEALTH INSURANCE | Admitting: Cardiology

## 2018-08-13 VITALS — BP 134/90 | HR 84 | Ht 67.0 in | Wt 167.2 lb

## 2018-08-13 DIAGNOSIS — E782 Mixed hyperlipidemia: Secondary | ICD-10-CM | POA: Diagnosis not present

## 2018-08-13 DIAGNOSIS — R079 Chest pain, unspecified: Secondary | ICD-10-CM | POA: Diagnosis not present

## 2018-08-13 DIAGNOSIS — Z789 Other specified health status: Secondary | ICD-10-CM

## 2018-08-13 DIAGNOSIS — Z8249 Family history of ischemic heart disease and other diseases of the circulatory system: Secondary | ICD-10-CM

## 2018-08-13 MED ORDER — METOPROLOL TARTRATE 50 MG PO TABS: 50.0000 mg | ORAL_TABLET | Freq: Once | ORAL | 0 refills | Status: DC

## 2018-08-13 NOTE — Patient Instructions (Addendum)
Medication Instructions:  The current medical regimen is effective;  continue present plan and medications.  Labwork: Please return Monday for fasting lab (Lipid).  Testing/Procedures: Your physician has requested that you have Coronary CT. Cardiac computed tomography (CT) is a painless test that uses an x-ray machine to take clear, detailed pictures of your heart. For further information please visit [email or web address]. Please follow instruction sheet as given.  You are being referred to the [facility] for treatment of your elevated LDL.  Follow-Up: Follow up as needed after the above testing.  Thank you for choosing [facility]!!    Please arrive at the [facility] main entrance of [facility] at          AM (30-45 minutes prior to test start time)  [facility] [place], [place] [address] [phone]  Proceed to the [facility] Radiology Department (First Floor).  Please follow these instructions carefully (unless otherwise directed):  Hold all erectile dysfunction medications at least 48 hours prior to test.  On the Night Before the Test: . Drink plenty of water. . Do not consume any caffeinated/decaffeinated beverages or chocolate 12 hours prior to your test. . Do not take any antihistamines 12 hours prior to your test.  On the Day of the Test: . Drink plenty of water. Do not drink any water within one hour of the test. . Do not eat any food 4 hours prior to the test. . You may take your regular medications prior to the test. . IF NOT ON A BETA BLOCKER - Take 50 mg of lopressor (metoprolol) one hour before the test. . HOLD Furosemide morning of the test.  After the Test: . Drink plenty of water. . After receiving IV contrast, you may experience a mild flushed feeling. This is normal. . On occasion, you may experience a mild rash up to 24 hours after the test. This is not dangerous. If this occurs, you can take Benadryl  25 mg and increase your fluid intake. . If you experience trouble breathing, this can be serious. If it is severe call 911 IMMEDIATELY. If it is mild, please call our office.

## 2018-08-13 NOTE — Addendum Note (Signed)
Addended by: Shellia Cleverly on: 08/13/2018 09:28 AM   Modules accepted: Orders

## 2018-08-13 NOTE — Progress Notes (Signed)
Cardiology Office Note:    Date:  08/13/2018   ID:  Cameron Larsen, Cameron Larsen Cameron Larsen Cameron Larsen, Cameron Larsen, MRN [id number]  PCP:  Cameron Ghent, MD  Cardiologist:  No primary care provider on file.   Referring MD: Cameron Ghent, MD     History of Present Illness:    Cameron Larsen is a 57 y.o. male here for evaluation of chest pain at the request of Dr. Damita Larsen.  It has been over 3 years since her last visit.  Back in 05/05/2014 he was seen for chest discomfort as well after being in the emergency department complaining of discomfort in the epigastric region that happen intermittently.  He was quite active at the time jogging did not notice any exertional discomfort.  Sometimes he felt discomfort after a fatty meal.  Burning-like sensation.  Had quite a bit of stress in the family.  His grandfather lived to 88 years old.  Had bypass surgery at age 88.  He has had friends with similar symptoms who had heart attack.  He also saw gastroenterology gave him Prilosec.  Today, 08/13/2018 he is complaining of some fatigue, joint aches over the past 3 weeks with intermittent chest pressure in the center of his chest moderate in severity.  Tums or antiacids did not help last night.  No associated shortness of breath does not seem to have any pressure with exercise.  He found a tick on his stomach about 2 weeks ago. Worse on 8/12 on Monday. Center of chest. Almost went to ER. Felt like it was moving up throat. ?GERD, was heped with GERD. Major fatigue. Dr. Damita Larsen labs [place]. Knees ache.   Past Medical History:  Diagnosis Date  . Allergy   . GERD (gastroesophageal reflux disease)    in the past  . Herniated cervical disc    surgery 2017  . Hyperlipidemia     Past Surgical History:  Procedure Laterality Date  . CERVICAL FUSION  2017  . COLON SURGERY    . DENTAL SURGERY    . POLYPECTOMY      Current Medications: Current Meds  Medication Sig  . Cholecalciferol (VITAMIN D) 2000 units CAPS Take 1 capsule (2,000  Units total) by mouth daily.  Marland Kitchen Co-Enzyme Q-10 100 MG CAPS Take 300 mg by mouth daily.  . diazepam (VALIUM) 5 MG tablet Take 0.5-1 tablets (2.5-5 mg total) by mouth daily as needed for anxiety (for plane flights).  Cameron Larsen Oil 300 MG CAPS Take 1 capsule by mouth daily.   . Probiotic Product (PROBIOTIC DAILY) CAPS Take 1 capsule by mouth daily.      Allergies:   Cefuroxime axetil; Ciprofloxacin; Lipitor [atorvastatin]; and Livalo [pitavastatin]   Social History   Socioeconomic History  . Marital status: Married    Spouse name: Not on file  . Number of children: 2  . Years of education: Not on file  . Highest education level: Not on file  Occupational History  . Occupation: Press photographer - Wellsite geologist: [employer]  . Financial resource strain: Not on file  . Food insecurity:    Worry: Not on file    Inability: Not on file  . Transportation needs:    Medical: Not on file    Non-medical: Not on file  Tobacco Use  . Smoking status: Former Research scientist (life sciences)  . Smokeless tobacco: Never Used  . Tobacco comment: 30 yrs ago in college  Substance and Sexual Activity  .  Alcohol use: Yes    Comment: 6 pack or less per week  . Drug use: No  . Sexual activity: Yes    Partners: Female  Lifestyle  . Physical activity:    Days per week: Not on file    Minutes per session: Not on file  . Stress: Not on file  Relationships  . Social connections:    Talks on phone: Not on file    Gets together: Not on file    Attends religious service: Not on file    Active member of club or organization: Not on file    Attends meetings of clubs or organizations: Not on file    Relationship status: Not on file  Other Topics Concern  . Not on file  Social History Narrative   HSG, [employer]. Married '92. 1 - Son '95; 1 daughter '00. 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 There is no history of Prostate cancer, Colon cancer, Esophageal cancer, Colon polyps, Rectal cancer, or Stomach cancer.  ROS:   Please see the history of present illness.     All other systems reviewed and are negative.  EKGs/Labs/Other Studies Reviewed:    The following studies were reviewed today: EKG, lab work, prior office notes reviewed.  LDL cholesterol was 200 on 07/08/2017  EKG: 08/03/2018-sinus rhythm with no other significant abnormalities.  Personally viewed.  Recent Labs: 08/03/2018: ALT 30; BUN 19; Creatinine, Ser 1.25; Hemoglobin 16.0; Platelets 186.0; Potassium 4.5; Sodium 138; TSH 4.15  Recent Lipid Panel    Component Value Date/Time   CHOL 269 (H) 07/08/2017 0915   CHOL 181 04/06/2015 0830   TRIG 99.0 07/08/2017 0915   TRIG 93 04/06/2015 0830   HDL 49.50 07/08/2017 0915   HDL 50 04/06/2015 0830   CHOLHDL 5 07/08/2017 0915   VLDL 19.8 07/08/2017 0915   LDLCALC 200 (H) 07/08/2017 0915   LDLCALC 112 (H) 04/06/2015 0830   LDLDIRECT 176.2 08/06/2010 0917    Physical Exam:    VS:  BP 134/90   Pulse 84   Ht 5\' 7"  (1.702 m)   Wt 167 lb 3.2 oz (75.8 kg)   SpO2 97%   BMI 26.19 kg/m     Wt Readings from Last 3 Encounters:  Cameron Larsen/06/19 167 lb 3.2 oz (75.8 kg)  08/03/18 165 lb 4 oz (75 kg)  07/31/17 165 lb (74.8 kg)     GEN:  Well nourished, well developed in no acute distress HEENT: Normal NECK: No JVD; No carotid bruits LYMPHATICS: No lymphadenopathy CARDIAC: RRR, no murmurs, rubs, gallops RESPIRATORY:  Clear to auscultation without rales, wheezing or rhonchi  ABDOMEN: Soft, non-tender, non-distended MUSCULOSKELETAL:  No edema; No deformity  SKIN: Warm and dry NEUROLOGIC:  Alert and oriented x 3 PSYCHIATRIC:  Normal affect   ASSESSMENT:    1. Chest pain,  unspecified type   2. Family history of cardiovascular disease   3. Mixed hyperlipidemia    PLAN:    In order of problems listed above:  Chest pain with family history -Given his recurrent chest pain, I would like to set him up for a CT scan with FFR analysis.  This will give [place] a  more definitive answer to the potential for coronary artery disease especially given his strong family history.  Father has coronary disease, stents for instance.  Grandfather had bypass surgery. -Certainly his pain could have been related to GERD, acid reflux as it seemed to be helped with omeprazole 14-day pack.  Statin intolerance -Has had trouble in the past with Lipitor and Livalo-aches.  Try them in 2015 and 2017.  I would like for him to once again touch base with our lipid clinic to discuss possible Repatha.  Family history of CAD - LDL at one point was 167 in 2011, now 200 on 07/08/2017.  This is concerning high level, likely a result of some degree of familial hyperlipidemia.  I will have him touch base again with our lipid clinic.  Possible Repatha.  He will touch base with Dr. Damita Larsen regarding tick bite.  These were normal.  Platelets were normal.  We will follow-up with results of testing.   Medication Adjustments/Labs and Tests Ordered: Current medicines are reviewed at length with the patient today.  Concerns regarding medicines are outlined above.  Orders Placed This Encounter  Procedures  . CT CORONARY MORPH W/CTA COR W/SCORE W/CA W/CM &/OR WO/CM  . CT CORONARY FRACTIONAL FLOW RESERVE DATA PREP  . CT CORONARY FRACTIONAL FLOW RESERVE FLUID ANALYSIS  . AMB Referral to [facility] ordered this encounter  Medications  . metoprolol tartrate (LOPRESSOR) 50 MG tablet    Sig: Take 1 tablet (50 mg total) by mouth once for 1 dose. Take 1 tablet 1 hour prior to Coronary CT    Dispense:  1 tablet    Refill:  0    Patient Instructions  Medication Instructions:  The  current medical regimen is effective;  continue present plan and medications.  Labwork: None  Testing/Procedures: Your physician has requested that you have Coronary CT. Cardiac computed tomography (CT) is a painless test that uses an x-ray machine to take clear, detailed pictures of your heart. For further information please visit [email or web address][facility]tn. Please follow instruction sheet as given.  You are being referred to the [facility] for treatment of your elevated LDL.  Follow-Up: Follow up as needed after the above testing.  Thank you for choosing [facility]!!    Please arrive at the [facility] main entrance of [facility] at          AM (30-45 minutes prior to test start time)  [facility] [place], [place] [address] [phone]  Proceed to the [facility] Radiology Department (First Floor).  Please follow these instructions carefully (unless otherwise directed):  Hold all erectile dysfunction medications at least 48 hours prior to test.  On the Night Before the Test: . Drink plenty of water. . Do not consume any caffeinated/decaffeinated beverages or chocolate 12 hours prior to your test. . Do not take any antihistamines 12 hours prior to your test.  On the Day of the Test: . Drink plenty of water. Do not drink any water within one hour of the test. . Do not eat any food 4 hours prior to the test. . You may take your regular medications prior to the test. . IF NOT ON A BETA BLOCKER - Take 50 mg of lopressor (metoprolol) one hour before the test. . HOLD Furosemide morning of the test.  After the Test: . Drink plenty of water. . After receiving IV contrast, you may experience a mild flushed feeling.  This is normal. . On occasion, you may experience a mild rash up to 24 hours after the test. This is not dangerous. If this occurs, you can take Benadryl 25 mg and increase your fluid intake. . If you experience trouble  breathing, this can be serious. If it is severe call 911 IMMEDIATELY. If it is mild, please call our office.     Signed, Candee Furbish, MD  08/13/2018 9:22 AM    [facility]

## 2018-08-16 ENCOUNTER — Other Ambulatory Visit: Payer: PRIVATE HEALTH INSURANCE | Admitting: *Deleted

## 2018-08-16 DIAGNOSIS — E782 Mixed hyperlipidemia: Secondary | ICD-10-CM

## 2018-08-16 LAB — LIPID PANEL
Chol/HDL Ratio: 4.5 ratio (ref 0.0–5.0)
Cholesterol, Total: 255 mg/dL — ABNORMAL HIGH (ref 100–199)
HDL: 57 mg/dL (ref >39)
LDL Calculated: 172 mg/dL — ABNORMAL HIGH (ref 0–99)
Triglycerides: 132 mg/dL (ref 0–149)
VLDL Cholesterol Cal: 26 mg/dL (ref 5–40)

## 2018-08-19 ENCOUNTER — Telehealth: Payer: Self-pay | Admitting: *Deleted

## 2018-08-19 NOTE — Telephone Encounter (Signed)
I need extra details and in general it is not a good idea to order tests w/o checking the patient first.  Please see what is going on, set up OV as needed.  Thanks.

## 2018-08-19 NOTE — Telephone Encounter (Signed)
Copied from [place] [id number]. Topic: General - Other >> Aug 19, 2018  2:51 PM Cecelia Byars, NT wrote: Reason for CRM: Patient called and would like an order for labs placed to test for a tick borne visus please call  [phone]

## 2018-08-19 NOTE — Telephone Encounter (Signed)
Left detailed message on voicemail. DPR 

## 2018-08-24 ENCOUNTER — Ambulatory Visit (INDEPENDENT_AMBULATORY_CARE_PROVIDER_SITE_OTHER): Payer: PRIVATE HEALTH INSURANCE | Admitting: Family Medicine

## 2018-08-24 ENCOUNTER — Encounter: Payer: Self-pay | Admitting: Family Medicine

## 2018-08-24 VITALS — BP 132/72 | HR 73 | Temp 98.5°F | Wt 165.5 lb

## 2018-08-24 DIAGNOSIS — M2550 Pain in unspecified joint: Secondary | ICD-10-CM | POA: Diagnosis not present

## 2018-08-24 DIAGNOSIS — R5383 Other fatigue: Secondary | ICD-10-CM

## 2018-08-24 DIAGNOSIS — E559 Vitamin D deficiency, unspecified: Secondary | ICD-10-CM

## 2018-08-24 LAB — SEDIMENTATION RATE: SED RATE: 9 mm/h (ref 0–20)

## 2018-08-24 LAB — VITAMIN D 25 HYDROXY (VIT D DEFICIENCY, FRACTURES): VITD: 40.51 ng/mL (ref 30.00–100.00)

## 2018-08-24 MED ORDER — VITAMIN D-3 125 MCG (5000 UT) PO TABS: 5000.0000 [IU] | ORAL_TABLET | Freq: Every day | ORAL | Status: AC

## 2018-08-24 NOTE — Progress Notes (Signed)
Fatigue in the last ~6 weeks.  He thought he had a lot of stressors recently with work and that could have contributed.  He is persistently fatigued but no fevers.  Recent labs d/w pt.  If he goes to be early he'll be rested in the AM but then fatigued after easy AM activity.    [facility] and able to exercise on the treadmill w/o troubles.  Some B knee and upper back and foot aches.    "I feel like I have a low grade virus."  No rash.  He did have a tick bites prev but had fatigue prior to that most recent bite.  Other bites predate the fatigue.  No rash.  No vomiting, no diarrhea.  He feels thirsty but is still drinking plenty of water.    He snores, some nights more than others.  No known apnea.  No AM HA.  He doesn't wake himself up from snoring.   Cardiac CT per cards clinic- pending/to be scheduled.    His sleep is disrupted by his cat in the middle of the night.  He is moving the cat outside.    Meds, vitals, and allergies reviewed.   ROS: Per HPI unless specifically indicated in ROS section   GEN: nad, alert and oriented HEENT: mucous membranes moist NECK: supple w/o LA CV: rrr. PULM: ctab, no inc wob ABD: soft, +bs EXT: no edema SKIN: no acute rash, he has minimal residual expected skin changes at previous tick bite site on the abdomen but no bull's-eye rash. He does not have active synovitis or joint line tenderness on the knees.  Normal range of motion bilaterally.

## 2018-08-24 NOTE — Patient Instructions (Signed)
Unclear cause of your symptoms.  Recheck your labs today.  We'll go from there.   Take care.  Glad to see you.

## 2018-08-25 DIAGNOSIS — R5383 Other fatigue: Secondary | ICD-10-CM | POA: Insufficient documentation

## 2018-08-25 NOTE — Assessment & Plan Note (Signed)
Unclear source.  Previous labs discussed with patient.  No clear causative factor previously identified.  Reasonable to recheck vitamin D and sed rate today.  Reasonable to check take labs given previous bite.  Would check rheumatoid factor given the joint aches that he has though he does not have any active synovitis. Unclear if he could have sleep apnea.  Not likely at this point.  He does not wake himself up from snoring and he does not have severe snoring per his report.   The family cat is disrupting his sleep some.  Unclear how much this and his work stress is affecting his situation.  We will check his labs today, he will try to regulate his sleep cycle, and we will go from there.  He agrees. >25 minutes spent in face to face time with patient, >50% spent in counselling or coordination of care.

## 2018-08-26 LAB — B. BURGDORFI ANTIBODIES BY WB

## 2018-08-26 LAB — REFLEX RMSF IGG TITER: RMSF IgG Titer: 1:64 {titer} — ABNORMAL HIGH

## 2018-08-26 LAB — ROCKY MTN SPOTTED FVR ABS PNL(IGG+IGM)
RMSF IgG: DETECTED — AB
RMSF IgM: NOT DETECTED

## 2018-08-26 LAB — RHEUMATOID FACTOR

## 2018-08-27 ENCOUNTER — Other Ambulatory Visit: Payer: Self-pay | Admitting: Family Medicine

## 2018-08-27 MED ORDER — DOXYCYCLINE HYCLATE 100 MG PO TABS: 100.0000 mg | ORAL_TABLET | Freq: Two times a day (BID) | ORAL | 0 refills | Status: DC

## 2018-09-07 ENCOUNTER — Ambulatory Visit (INDEPENDENT_AMBULATORY_CARE_PROVIDER_SITE_OTHER): Payer: PRIVATE HEALTH INSURANCE | Admitting: Pharmacist

## 2018-09-07 DIAGNOSIS — E782 Mixed hyperlipidemia: Secondary | ICD-10-CM

## 2018-09-07 MED ORDER — ROSUVASTATIN CALCIUM 5 MG PO TABS: 5.0000 mg | ORAL_TABLET | Freq: Every day | ORAL | 11 refills | Status: DC

## 2018-09-07 NOTE — Patient Instructions (Signed)
It was nice to meet you today  Start taking rosuvastatin (Crestor) 5mg  once a day for your cholesterol.  We may add on (ezetimibe) Zetia 10mg  daily to help lower your LDL further. Using this in combination with a low dose of Crestor would lower your LDL close to 50%  Praluent and Repatha are 2 cholesterol injections that we could try as well - they each lower your LDL cholesterol 60%  I will touch base about your CT FFR test so that this can be scheduled  Your current LDL goal is < 10 due to your family history of heart disease  If your test shows plaque build up, we may lower your LDL goal to < 70  Call [place], Pharmacist in clinic if you have problems tolerating the Crestor [phone]

## 2018-09-07 NOTE — Progress Notes (Signed)
Patient ID: Cameron Larsen                 DOB: Apr 12, 1961                    MRN: [id number]     HPI: Cameron Larsen is a 57 y.o. male patient referred to lipid clinic by Dr Cameron Larsen. PMH is significant for chest pain, HLD, GERD, and family of CAD (father with CABG in 43s). He was previously seen in lipid clinic in 2016 and did not follow up with cardiology until last month when he was referred by PCP due to chest pain. Pt was scheduled for CT scan with FFR, however this has not been completed yet.  Pt presents today in good spirits. He has not heard about scheduling his CT FFR. He is currently taking krill oil and CoQ10. He previously took Lipitor, Livalo, and lovastatin, all of which caused muscular and joint pain in his legs and knees. He has increased his cardio activity this past year and limits his intake of red meat and saturated fats.  Current Medications: krill oil 300mg  daily, CoQ10 300mg  daily Intolerances: Lipitor 20mg  daily, Livalo 2mg  daily, lovastatin 20mg  daily - muscle aches  Risk Factors: family history of CAD (father with CABG in 26s)  LDL goal: 100mg /dL due to family history of CAD, may be adjusted pending results of CT FFR  Diet: Tries to limit fried food and sugar. Reduced red meat intake to once a week, otherwise lean meat and fish. Avoids full fat dairy products.  Exercise: A lot of cardio and running this past year.  Family History: The patient's family history includes CAD and HLD in his father (CABG in his 73s).  Social History: Former smoker, quit > 30 years ago, some alcohol use, denies illicit drug use.  Labs: 08/16/18: TC 255, TG 132, HDL 57, LDL 172 (no lipid lowering therapy) 04/06/15: TC 181, TG 93, HDL 50, LDL 112, LDL-P 1526 (Livalo 2mg  daily)  Past Medical History:  Diagnosis Date  . Allergy   . GERD (gastroesophageal reflux disease)    in the past  . Herniated cervical disc    surgery 2017  . Hyperlipidemia     Current Outpatient Medications  on File Prior to Visit  Medication Sig Dispense Refill  . Cholecalciferol (VITAMIN D-3) 5000 units TABS Take 5,000 Int'l Units by mouth daily.    Marland Kitchen Co-Enzyme Q-10 100 MG CAPS Take 300 mg by mouth daily.    . diazepam (VALIUM) 5 MG tablet Take 0.5-1 tablets (2.5-5 mg total) by mouth daily as needed for anxiety (for plane flights). 5 tablet 0  . doxycycline (VIBRA-TABS) 100 MG tablet Take 1 tablet (100 mg total) by mouth 2 (two) times daily. 20 tablet 0  . Krill Oil 300 MG CAPS Take 1 capsule by mouth daily.     . metoprolol tartrate (LOPRESSOR) 50 MG tablet Take 1 tablet (50 mg total) by mouth once for 1 dose. Take 1 tablet 1 hour prior to Coronary CT 1 tablet 0  . Probiotic Product (PROBIOTIC DAILY) CAPS Take 1 capsule by mouth daily.      No current facility-administered medications on file prior to visit.     Allergies  Allergen Reactions  . Cefuroxime Axetil   . Ciprofloxacin     fever  . Lipitor [Atorvastatin]     Muscle aches on 20 mg qd  . Livalo [Pitavastatin] Other (See Comments)    aches  Assessment/Plan:  1. Hyperlipidemia - Baseline LDL 172 above goal < 100 due to hx of premature CAD. Pt has not heard about scheduling CT FFR - order was placed a month ago. Will send message to scheduling to reach out to pt. Pending results of Ct FFR, LDL goal may need to be lowered to < 70 if significant blockages are found. Pt is intolerant to 3 statins. Discussed rechallenging with another statin vs PCSK9i therapy. He prefers to try oral therapy first. Will start rosuvastatin 5mg  daily and plan to titrate therapy to max tolerated dose. Can add Zetia if needed once statin is optimized if further LLT is required. Advised pt to contact clinic with any side effects. If he does not tolerate low dose Crestor, he is agreeable to trying PCSK9i therapy. Expected benefits, side effects, and injection technique were discussed in clinic today.    Cameron Larsen E. Cameron Larsen, PharmD, BCACP, Cameron Larsen [address], [place], [place] [address] Phone: [phone]; Fax: [phone] 09/07/2018 3:48 PM

## 2018-10-15 ENCOUNTER — Other Ambulatory Visit: Payer: Self-pay | Admitting: Family Medicine

## 2018-10-15 NOTE — Telephone Encounter (Signed)
Name of Medication: diazepam 5 mg [facility] of [facility] Last Fill or Written Date and Quantity: # 5 on 11/08/17 Last Office Visit and Type: acute visit 08/24/18                                             Annual exam on 07/10/17 Next Office Visit and Type:none scheduled

## 2018-10-17 MED ORDER — DIAZEPAM 5 MG PO TABS: 2.5000 mg | ORAL_TABLET | Freq: Every day | ORAL | 0 refills | Status: DC | PRN

## 2018-10-17 NOTE — Telephone Encounter (Signed)
Sent. Thanks.   

## 2018-10-19 ENCOUNTER — Ambulatory Visit (HOSPITAL_COMMUNITY)
Admission: RE | Admit: 2018-10-19 | Discharge: 2018-10-19 | Disposition: A | Discharge status: Home / Self Care | Payer: PRIVATE HEALTH INSURANCE | Source: Ambulatory Visit | Attending: Cardiology | Admitting: Cardiology

## 2018-10-19 ENCOUNTER — Ambulatory Visit (HOSPITAL_COMMUNITY): Admission: RE | Admit: 2018-10-19 | Payer: PRIVATE HEALTH INSURANCE | Source: Ambulatory Visit

## 2018-10-19 DIAGNOSIS — R079 Chest pain, unspecified: Secondary | ICD-10-CM | POA: Insufficient documentation

## 2018-10-19 DIAGNOSIS — Z8249 Family history of ischemic heart disease and other diseases of the circulatory system: Secondary | ICD-10-CM | POA: Diagnosis present

## 2018-10-19 IMAGING — CT CT HEART MORP W/ CTA COR W/ SCORE W/ CA W/CM &/OR W/O CM
4 of 7 series · 8 of 20 positions shown, 9 images · non-contrast
Comparison: None.

Addendum:
EXAM:
OVER-READ INTERPRETATION  CT CHEST

The following report is an over-read performed by radiologist Dr.
MANCE [REDACTED] on [DATE]. This over-read
does not include interpretation of cardiac or coronary anatomy or
pathology. The coronary calcium score/coronary CTA interpretation by
the cardiologist is attached.
CLINICAL DATA: Chest pain
Cardiac CTA
MEDICATIONS:
Sub lingual nitro. 4mg x 2
TECHNIQUE: The patient was scanned on a Siemens [REDACTED]ice scanner. Gantry
rotation speed was 250 msecs. Collimation was 0.6 mm. A 100 kV
prospective scan was triggered in the ascending thoracic aorta at
35-75% of the R-R interval. Average HR during the scan was 60 bpm.
The 3D data set was interpreted on a dedicated work station using
MPR, MIP and VRT modes. A total of 80cc of contrast was used.

[Series 6: best diast 74 % · axial · 0.37mm/px · z∈[+1065,+1103]mm · 2 of 286 slices shown, 3 images]
[im 96/286  vessel]
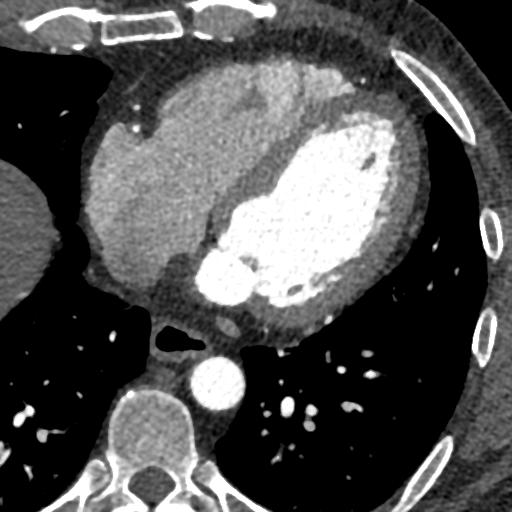
[im 96/286  lung]
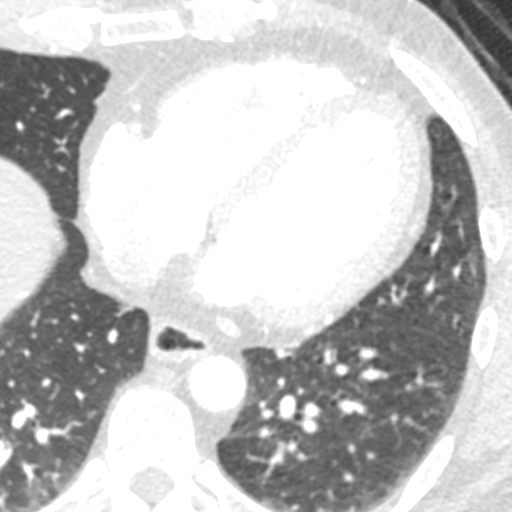
[im 191/286  vessel]
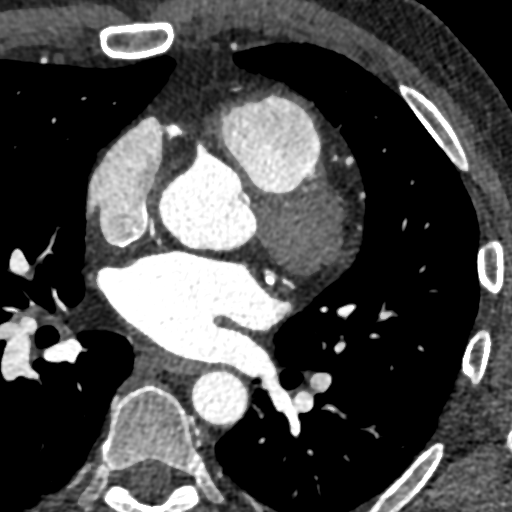

[Series 7: best syst 38 % · axial · 0.37mm/px · z∈[+1065,+1103]mm · 2 of 286 slices shown]
[im 96/286  vessel]
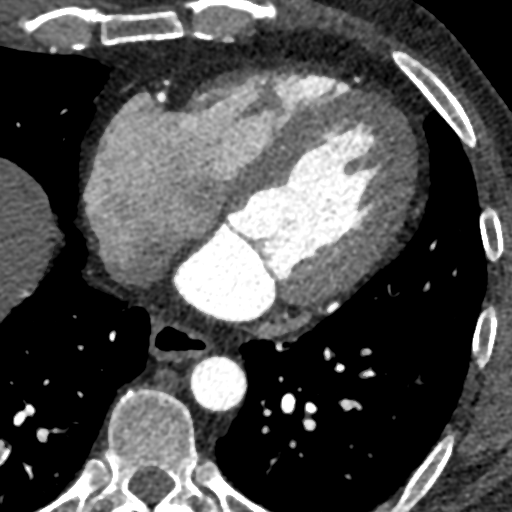
[im 191/286  vessel]
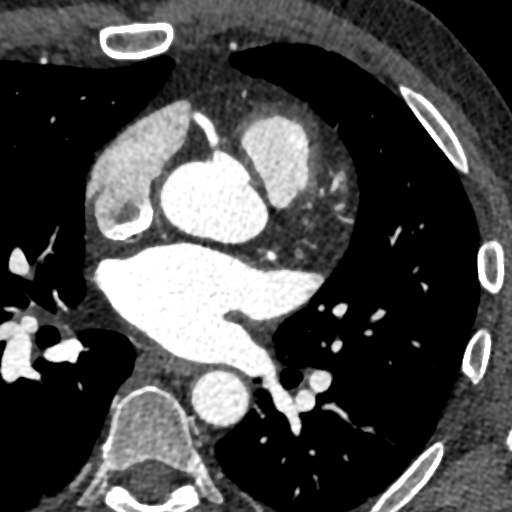

[Series 8: ts diast sharp 74 % · axial · 0.37mm/px · z∈[+1065,+1103]mm · 2 of 286 slices shown]
[im 96/286  lung]
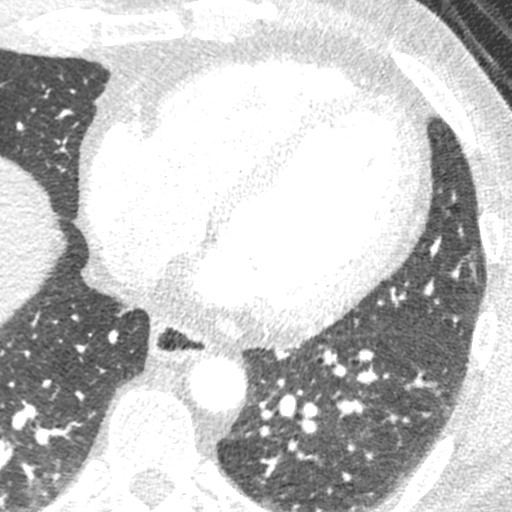
[im 191/286  lung]
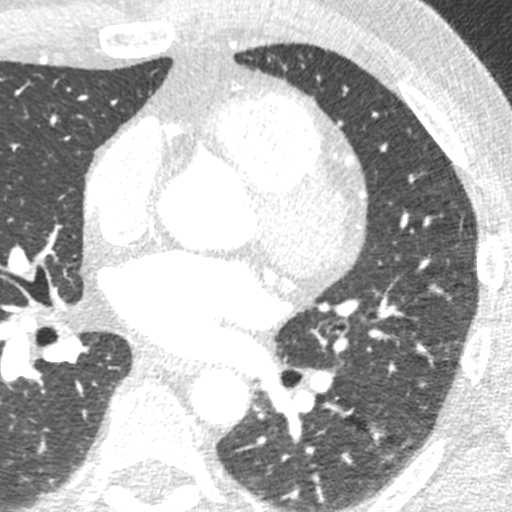

[Series 9: ts syst sharp 38 % · axial · 0.37mm/px · z∈[+1065,+1103]mm · 2 of 286 slices shown]
[im 96/286  lung]
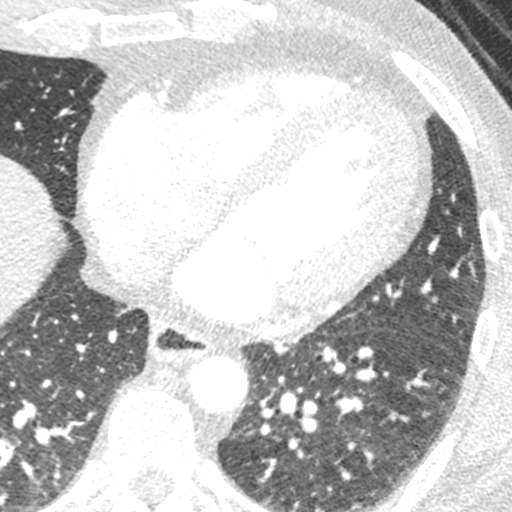
[im 191/286  lung]
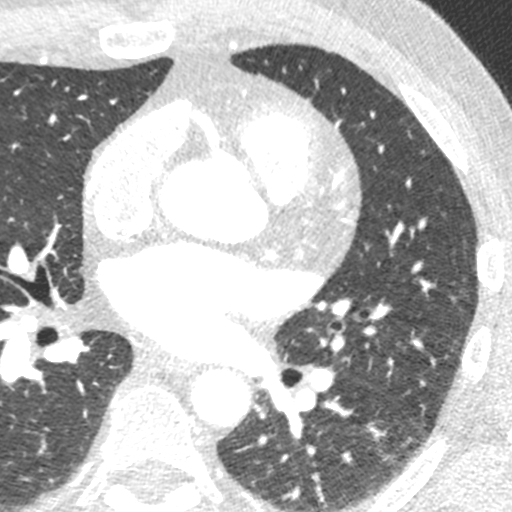

[8 of 20 positions shown; findings below may reference images not displayed]

FINDINGS: Vascular: Normal caliber of the visualized thoracic aorta. The main
and central pulmonary arteries are patent. No significant
pericardial fluid.

Mediastinum/Nodes: Mediastinal and hilar structures are unremarkable
without lymph node enlargement.

Lungs/Pleura: No large pleural effusions. Visualized lungs are
clear.

Upper Abdomen: Upper abdomen is unremarkable.

Musculoskeletal: Unremarkable.
IMPRESSION: Negative over-read exam.
FINDINGS: Non-cardiac: See separate report from [REDACTED].

Pulmonary veins drained normally to the left atrium.

Calcium Score: 208 Agatston units.

Coronary Arteries: Right dominant with no anomalies

LM: No plaque or stenosis.

LAD system: Mixed plaque with mild (<50%) stenosis in the proximal
LAD.

Circumflex system: Small ramus, no significant disease. Mixed plaque
with mild (<50%) stenosis in the proximal LCx.

RCA system: No plaque or stenosis.
IMPRESSION: 1. Coronary artery calcium score 208 Agatston units. This places the
patient in the 88th percentile for age and gender, suggesting high
risk for future cardiac events.

2. Suspect mild stenosis proximal LAD and proximal LCx. Will confirm
with FFR.

MANCE

*** End of Addendum ***

## 2018-10-19 MED ORDER — NITROGLYCERIN 0.4 MG SL SUBL
SUBLINGUAL_TABLET | SUBLINGUAL | Status: AC
  Filled 2018-10-19: qty 2

## 2018-10-19 MED ORDER — NITROGLYCERIN 0.4 MG SL SUBL
0.8000 mg | SUBLINGUAL_TABLET | Freq: Once | SUBLINGUAL | Status: AC
  Administered 2018-10-19: 0.8 mg via SUBLINGUAL
  Filled 2018-10-19: qty 25

## 2018-10-19 MED ORDER — IOPAMIDOL (ISOVUE-370) INJECTION 76%
100.0000 mL | Freq: Once | INTRAVENOUS | Status: AC | PRN
  Administered 2018-10-19: 100 mL via INTRAVENOUS

## 2018-10-21 ENCOUNTER — Telehealth: Payer: Self-pay | Admitting: Pharmacist

## 2018-10-21 NOTE — Telephone Encounter (Signed)
Called pt to follow up with rosuvastatin tolerability. He did not start rosuvastatin yet as he was waiting until he had his CT FFR scan done. Discussed results as follows:   1. Coronary artery calcium score 208 Agatston units. This places the patient in the 88th percentile for age and gender, suggesting high risk for future cardiac events. 2. Suspect mild stenosis proximal LAD and proximal LCx. Will confirm with FFR.  Prefer to target aggressive LDL goal < 70. Advised pt to start rosuvastatin today with goal to titrate up to highest tolerated dose. Advised him to call clinic if he experiences side effects and we will pursue PCSK9i therapy. Will also reach out to pt in 1 month to assess tolerability and intensify lipid lowering therapy at that time if I have not heard from him by then. Myalgias historically presented within the first 2 weeks of statin therapy for pt.

## 2018-10-27 ENCOUNTER — Ambulatory Visit (HOSPITAL_COMMUNITY)
Admission: RE | Admit: 2018-10-27 | Discharge: 2018-10-27 | Disposition: A | Discharge status: Home / Self Care | Payer: PRIVATE HEALTH INSURANCE | Source: Ambulatory Visit | Attending: Cardiology | Admitting: Cardiology

## 2018-10-27 DIAGNOSIS — Z8249 Family history of ischemic heart disease and other diseases of the circulatory system: Secondary | ICD-10-CM | POA: Diagnosis not present

## 2018-10-27 DIAGNOSIS — R079 Chest pain, unspecified: Secondary | ICD-10-CM

## 2018-10-28 NOTE — Telephone Encounter (Signed)
Agree with starting high intensity Crestor with his known CAD. Thanks. Great work Candee Furbish, MD

## 2018-12-30 ENCOUNTER — Telehealth: Payer: Self-pay | Admitting: Pharmacist

## 2018-12-30 NOTE — Telephone Encounter (Signed)
Spoke with pt - he reports tolerating rosuvastatin 5mg  well but did not start therapy until 12/23/18. He will continue current dose for the next 1-2 weeks since myalgias typically present in about 2 weeks of therapy for him. If tolerating well at that point, he was instructed to increase his dose to 10mg  daily and let us know. Will call pt to follow up in 4-6 weeks if we have not heard from him by then.

## 2018-12-30 NOTE — Telephone Encounter (Signed)
LMOM for pt to see how he is tolerating rosuvastatin 5mg  daily.

## 2019-02-15 ENCOUNTER — Telehealth: Payer: Self-pay | Admitting: Pharmacist

## 2019-02-15 NOTE — Telephone Encounter (Signed)
LMOM for pt to see if he has increased rosuvastatin dose from 5mg  to 10mg  daily and if he is tolerating well. Ideally want to increase rosuvastatin to max tolerated dose due to elevated calcium score.

## 2019-02-24 NOTE — Telephone Encounter (Signed)
LMOM again. 

## 2019-03-08 NOTE — Telephone Encounter (Signed)
LMOM again. 

## 2019-04-05 NOTE — Telephone Encounter (Signed)
LMOM again. 

## 2019-04-27 NOTE — Telephone Encounter (Signed)
LMOM again. Will await patient's return call as this was 5th attempt to reach patient.

## 2019-07-11 ENCOUNTER — Telehealth: Payer: Self-pay | Admitting: Family Medicine

## 2019-07-11 DIAGNOSIS — Z9189 Other specified personal risk factors, not elsewhere classified: Secondary | ICD-10-CM

## 2019-07-11 NOTE — Telephone Encounter (Signed)
Patient called today requesting a covid test be ordered. He stated he feels like he has a sinus infections but because he will be going to an event in several weeks it thinks it will be better just to go ahead and be tested.      C/B # [phone]

## 2019-07-11 NOTE — Telephone Encounter (Signed)
Ordered. Please arrange/advise patient on testing.  Thanks.

## 2019-07-12 ENCOUNTER — Other Ambulatory Visit: Payer: Self-pay

## 2019-07-12 DIAGNOSIS — Z20822 Contact with and (suspected) exposure to covid-19: Secondary | ICD-10-CM

## 2019-07-12 NOTE — Telephone Encounter (Signed)
Patient called back to check on Covid order.  Advised patient it had been ordered and aware of testing sites and times He stated he will be going to the [facility] today.  Patient was advised to quarantine until we receive his results and can call with those.    Patient's C/B # [phone]

## 2019-07-12 NOTE — Telephone Encounter (Signed)
Thanks

## 2019-07-13 LAB — NOVEL CORONAVIRUS, NAA: SARS-CoV-2, NAA: NOT DETECTED

## 2019-09-29 ENCOUNTER — Other Ambulatory Visit: Payer: Self-pay | Admitting: Cardiology

## 2019-11-04 ENCOUNTER — Other Ambulatory Visit: Payer: Self-pay | Admitting: Cardiology

## 2019-11-08 ENCOUNTER — Telehealth: Payer: Self-pay | Admitting: Cardiology

## 2019-11-08 MED ORDER — ROSUVASTATIN CALCIUM 5 MG PO TABS: 5.0000 mg | ORAL_TABLET | Freq: Every day | ORAL | 0 refills | Status: DC

## 2019-11-08 NOTE — Telephone Encounter (Signed)
Pt's medication was sent to pt's pharmacy as requested. Confirmation received.  °

## 2019-11-08 NOTE — Telephone Encounter (Signed)
°*  STAT* If patient is at the pharmacy, call can be transferred to refill team.   1. Which medications need to be refilled? (please list name of each medication and dose if known) rosuvastatin (CRESTOR) 5 MG tablet  2. Which pharmacy/location (including street and city if local pharmacy) is medication to be sent to? [place], [place]  3. Do they need a 30 day or 90 day supply? 90   Patient has appt with Dr. Marlou Porch on 11/15/19

## 2019-11-15 ENCOUNTER — Encounter: Payer: Self-pay | Admitting: Cardiology

## 2019-11-15 ENCOUNTER — Ambulatory Visit (INDEPENDENT_AMBULATORY_CARE_PROVIDER_SITE_OTHER): Payer: PRIVATE HEALTH INSURANCE | Admitting: Cardiology

## 2019-11-15 ENCOUNTER — Other Ambulatory Visit: Payer: Self-pay

## 2019-11-15 VITALS — BP 148/92 | HR 72 | Ht 67.0 in | Wt 164.1 lb

## 2019-11-15 DIAGNOSIS — I2584 Coronary atherosclerosis due to calcified coronary lesion: Secondary | ICD-10-CM

## 2019-11-15 DIAGNOSIS — E782 Mixed hyperlipidemia: Secondary | ICD-10-CM | POA: Diagnosis not present

## 2019-11-15 DIAGNOSIS — I2510 Atherosclerotic heart disease of native coronary artery without angina pectoris: Secondary | ICD-10-CM

## 2019-11-15 DIAGNOSIS — Z8249 Family history of ischemic heart disease and other diseases of the circulatory system: Secondary | ICD-10-CM

## 2019-11-15 DIAGNOSIS — Z789 Other specified health status: Secondary | ICD-10-CM

## 2019-11-15 LAB — LIPID PANEL
Chol/HDL Ratio: 3.6 ratio (ref 0.0–5.0)
Cholesterol, Total: 226 mg/dL — ABNORMAL HIGH (ref 100–199)
HDL: 62 mg/dL (ref >39)
LDL Chol Calc (NIH): 142 mg/dL — ABNORMAL HIGH (ref 0–99)
Triglycerides: 125 mg/dL (ref 0–149)
VLDL Cholesterol Cal: 22 mg/dL (ref 5–40)

## 2019-11-15 MED ORDER — ASPIRIN EC 81 MG PO TBEC: 81.0000 mg | DELAYED_RELEASE_TABLET | Freq: Every day | ORAL | 3 refills | Status: AC

## 2019-11-15 NOTE — Progress Notes (Signed)
Cardiology Office Note:    Date:  11/15/2019   ID:  Cameron Larsen, DOB 05-Apr-1961, MRN [id number]  PCP:  Tonia Ghent, MD  Cardiologist:  No primary care provider on file.   Referring MD: Tonia Ghent, MD     History of Present Illness:    Cameron Larsen is a 58 y.o. male here for follow-up nonobstructive coronary artery disease, calcium score of 200, statin intolerance, hyperlipidemia with prior chest pain at the request of Dr. Damita Dunnings.    Back in 05/05/2014 he was seen for chest discomfort as well after being in the emergency department complaining of discomfort in the epigastric region that happen intermittently.  He was quite active at the time jogging did not notice any exertional discomfort.  Sometimes he felt discomfort after a fatty meal.  Burning-like sensation.  Had quite a bit of stress in the family.  His grandfather lived to 78 years old.  Had bypass surgery at age 30.  He has had friends with similar symptoms who had heart attack.  He also saw gastroenterology gave him Prilosec.  On 08/13/2018 he had fatigue, joint aches with intermittent chest pressure in the center of his chest moderate in severity.  Tums or antiacids did not help l.  No associated shortness of breath does not seem to have any pressure with exercise.   11/15/2019-here for minimal coronary artery disease follow-up.  Overall doing quite well.  Denies any fevers chills nausea vomiting syncope bleeding.  Able to comply with Crestor 5 mg once a day.  Past Medical History:  Diagnosis Date  . Allergy   . GERD (gastroesophageal reflux disease)    in the past  . Herniated cervical disc    surgery 2017  . Hyperlipidemia     Past Surgical History:  Procedure Laterality Date  . CERVICAL FUSION  2017  . COLON SURGERY    . DENTAL SURGERY    . POLYPECTOMY      Current Medications: Current Meds  Medication Sig  . Cholecalciferol (VITAMIN D-3) 5000 units TABS Take 5,000 Int'l Units by  mouth daily.  Marland Kitchen Co-Enzyme Q-10 100 MG CAPS Take 300 mg by mouth daily.  . diazepam (VALIUM) 5 MG tablet Take 0.5-1 tablets (2.5-5 mg total) by mouth daily as needed for anxiety (for plane flights).  Cameron Larsen Oil 300 MG CAPS Take 1 capsule by mouth daily.   . Probiotic Product (PROBIOTIC DAILY) CAPS Take 1 capsule by mouth daily.   . rosuvastatin (CRESTOR) 5 MG tablet Take 1 tablet (5 mg total) by mouth daily. Please keep upcoming appt with Dr. Marlou Porch for future refills. Thank you     Allergies:   Cefuroxime axetil, Ciprofloxacin, Lipitor [atorvastatin], and Livalo [pitavastatin]   Social History   Socioeconomic History  . Marital status: Married    Spouse name: Not on file  . Number of children: 2  . Years of education: Not on file  . Highest education level: Not on file  Occupational History  . Occupation: Press photographer - Wellsite geologist: [employer]  . Financial resource strain: Not on file  . Food insecurity    Worry: Not on file    Inability: Not on file  . Transportation needs    Medical: Not on file    Non-medical: Not on file  Tobacco Use  . Smoking status: Former Research scientist (life sciences)  . Smokeless tobacco: Never Used  . Tobacco comment: 30  yrs ago in college  Substance and Sexual Activity  . Alcohol use: Yes    Comment: 6 pack or less per week  . Drug use: No  . Sexual activity: Yes    Partners: Female  Lifestyle  . Physical activity    Days per week: Not on file    Minutes per session: Not on file  . Stress: Not on file  Relationships  . Social Herbalist on phone: Not on file    Gets together: Not on file    Attends religious service: Not on file    Active member of club or organization: Not on file    Attends meetings of clubs or organizations: Not on file    Relationship status: Not on file  Other Topics Concern  . Not on file  Social History Narrative   HSG, [employer]. Married '92. 1 - Son '95; 1 daughter '00. 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 There is no history of Prostate cancer, Colon cancer, Esophageal cancer, Colon polyps, Rectal cancer, or Stomach cancer.  ROS:   Please see the history of present illness.     All other systems reviewed and are negative.  EKGs/Labs/Other Studies Reviewed:    The following studies were reviewed today: EKG, lab work, prior office notes reviewed.  LDL cholesterol was 200 on 07/08/2017  EKG: 11/15/19 08/03/2018-sinus rhythm with no other significant abnormalities.  Personally viewed.  Recent Labs: No results found for requested labs within last 8760 hours.  Recent Lipid Panel    Component Value Date/Time   CHOL 255 (H) 08/16/2018 0732   CHOL 181 04/06/2015 0830   TRIG 132 08/16/2018 0732   TRIG 93 04/06/2015 0830   HDL 57 08/16/2018 0732   HDL 50 04/06/2015 0830   CHOLHDL 4.5 08/16/2018 0732   CHOLHDL 5 07/08/2017 0915   VLDL 19.8 07/08/2017 0915   LDLCALC 172 (H) 08/16/2018 0732   LDLCALC 112 (H) 04/06/2015 0830   LDLDIRECT 176.2 08/06/2010 0917    Physical Exam:    VS:  BP (!) 148/92   Pulse 72   Ht 5\' 7"  (1.702 m)   Wt 164 lb 1.9 oz (74.4 kg)   BMI 25.70 kg/m     Wt Readings from Last 3 Encounters:  11/15/19 164 lb 1.9 oz (74.4 kg)  08/24/18 165 lb 8 oz (75.1 kg)  08/13/18 167 lb 3.2 oz (75.8 kg)     GEN:  Well nourished, well developed in no acute distress HEENT: Normal NECK: No JVD; No carotid bruits LYMPHATICS: No lymphadenopathy CARDIAC: RRR, no murmurs, rubs, gallops RESPIRATORY:  Clear to auscultation without rales, wheezing or rhonchi  ABDOMEN: Soft, non-tender, non-distended MUSCULOSKELETAL:  No edema;  No deformity  SKIN: Warm and dry NEUROLOGIC:  Alert and oriented x 3 PSYCHIATRIC:  Normal affect   ASSESSMENT:    1. Mixed hyperlipidemia   2. Statin intolerance   3. Family history of cardiovascular disease   4. Coronary artery disease involving native coronary artery of native heart without angina pectoris   5. Calcification of coronary artery    PLAN:    In order of  problems listed above:  Chest pain with family history   Father has coronary disease, stents for instance.  Grandfather had bypass surgery.  No recent episodes of chest discomfort.  CT scan with no flow-limiting CAD.  Minor plaque.  Calcium score 200.  Elevated.  Statin intolerance -Has had trouble in the past with Lipitor and Livalo-aches.  Tried them in 2015 and 2017.  He seems to be tolerating Crestor 5 mg once a day very well.  He has been taking this for several months.  We will go ahead and check a lipid panel today.  Depending on results of lipid panel, we may increase to 10 mg of Crestor daily.  He would like to see the results of the 5 mg first.  Make sense.  Family history of CAD - LDL at one point was 167 in 2011, now 200 on 07/08/2017.  This is concerning high level, likely a result of some degree of familial hyperlipidemia.  Lipid clinic.  He was asked to start Crestor 5 mg and possibly increase to 10.  See above.  Coronary calcification/minimal nonobstructive coronary artery disease -208 calcium score, 88th percentile.  Suspect mild stenosis in the proximal LAD and proximal left circumflex.  No hemodynamically significant stenosis noted on FFR analysis.  We will add aspirin 81 mg   Medication Adjustments/Labs and Tests Ordered: Current medicines are reviewed at length with the patient today.  Concerns regarding medicines are outlined above.  Orders Placed This Encounter  Procedures  . Lipid Profile  . EKG 12/Charge capture   Meds ordered this encounter  Medications  . aspirin EC 81 MG tablet    Sig:  Take 1 tablet (81 mg total) by mouth daily.    Dispense:  90 tablet    Refill:  3    Patient Instructions  Medication Instructions:  Please take Aspirin 81 mg once a day.  Continue all other medications as listed.  *If you need a refill on your cardiac medications before your next appointment, please call your pharmacy*  Lab Work: Please have blood work today. (Lipid) If you have labs (blood work) drawn today and your tests are completely normal, you will receive your results only by: Marland Kitchen MyChart Message (if you have MyChart) OR . A paper copy in the mail If you have any lab test that is abnormal or we need to change your treatment, we will call you to review the results.  Follow-Up: At [facility], you and your health needs are our priority.  As part of our continuing mission to provide you with exceptional heart care, we have created designated Provider Care Teams.  These Care Teams include your primary Cardiologist (physician) and Advanced Practice Providers (APPs -  Physician Assistants and Nurse Practitioners) who all work together to provide you with the care you need, when you need it.  Your next appointment:   12 month(s)  The format for your next appointment:   In Person  Provider:   Candee Furbish, MD  Thank you for choosing [facility]!!        Signed, Candee Furbish, MD  11/15/2019 9:32 AM    [place]

## 2019-11-15 NOTE — Patient Instructions (Addendum)
Medication Instructions:  Please take Aspirin 81 mg once a day.  Continue all other medications as listed.  *If you need a refill on your cardiac medications before your next appointment, please call your pharmacy*  Lab Work: Please have blood work today. (Lipid) If you have labs (blood work) drawn today and your tests are completely normal, you will receive your results only by: Marland Kitchen MyChart Message (if you have MyChart) OR . A paper copy in the mail If you have any lab test that is abnormal or we need to change your treatment, we will call you to review the results.  Follow-Up: At [facility], you and your health needs are our priority.  As part of our continuing mission to provide you with exceptional heart care, we have created designated Provider Care Teams.  These Care Teams include your primary Cardiologist (physician) and Advanced Practice Providers (APPs -  Physician Assistants and Nurse Practitioners) who all work together to provide you with the care you need, when you need it.  Your next appointment:   12 month(s)  The format for your next appointment:   In Person  Provider:   Candee Furbish, MD  Thank you for choosing [facility]!!

## 2019-11-17 ENCOUNTER — Other Ambulatory Visit: Payer: Self-pay | Admitting: *Deleted

## 2019-11-17 DIAGNOSIS — Z79899 Other long term (current) drug therapy: Secondary | ICD-10-CM

## 2019-11-17 DIAGNOSIS — E782 Mixed hyperlipidemia: Secondary | ICD-10-CM

## 2019-11-17 MED ORDER — ROSUVASTATIN CALCIUM 10 MG PO TABS: 10.0000 mg | ORAL_TABLET | Freq: Every day | ORAL | 3 refills | Status: DC

## 2019-12-06 ENCOUNTER — Ambulatory Visit: Payer: PRIVATE HEALTH INSURANCE | Attending: Internal Medicine

## 2019-12-06 DIAGNOSIS — Z20822 Contact with and (suspected) exposure to covid-19: Secondary | ICD-10-CM

## 2019-12-07 LAB — NOVEL CORONAVIRUS, NAA: SARS-CoV-2, NAA: NOT DETECTED

## 2020-02-15 ENCOUNTER — Other Ambulatory Visit: Payer: Self-pay

## 2020-02-15 ENCOUNTER — Other Ambulatory Visit: Payer: PRIVATE HEALTH INSURANCE | Admitting: *Deleted

## 2020-02-15 DIAGNOSIS — E782 Mixed hyperlipidemia: Secondary | ICD-10-CM

## 2020-02-15 DIAGNOSIS — Z79899 Other long term (current) drug therapy: Secondary | ICD-10-CM

## 2020-02-15 LAB — LIPID PANEL
Chol/HDL Ratio: 3.3 ratio (ref 0.0–5.0)
Cholesterol, Total: 182 mg/dL (ref 100–199)
HDL: 55 mg/dL (ref >39)
LDL Chol Calc (NIH): 112 mg/dL — ABNORMAL HIGH (ref 0–99)
Triglycerides: 84 mg/dL (ref 0–149)
VLDL Cholesterol Cal: 15 mg/dL (ref 5–40)

## 2020-02-15 LAB — ALT: ALT: 33 IU/L (ref 0–44)

## 2020-04-03 ENCOUNTER — Telehealth (INDEPENDENT_AMBULATORY_CARE_PROVIDER_SITE_OTHER): Payer: PRIVATE HEALTH INSURANCE | Admitting: Internal Medicine

## 2020-04-03 ENCOUNTER — Encounter: Payer: Self-pay | Admitting: Internal Medicine

## 2020-04-03 ENCOUNTER — Other Ambulatory Visit: Payer: Self-pay

## 2020-04-03 VITALS — Temp 98.2°F

## 2020-04-03 DIAGNOSIS — B9789 Other viral agents as the cause of diseases classified elsewhere: Secondary | ICD-10-CM | POA: Diagnosis not present

## 2020-04-03 DIAGNOSIS — J301 Allergic rhinitis due to pollen: Secondary | ICD-10-CM | POA: Diagnosis not present

## 2020-04-03 DIAGNOSIS — J329 Chronic sinusitis, unspecified: Secondary | ICD-10-CM

## 2020-04-03 NOTE — Progress Notes (Signed)
Virtual Visit via Video Note  I connected with Cameron Larsen on 04/03/20 at  2:00 PM EDT by a video enabled telemedicine application and verified that I am speaking with the correct person using two identifiers.  Location: Patient: Home Provider: Office   I discussed the limitations of evaluation and management by telemedicine and the availability of in person appointments. The patient expressed understanding and agreed to proceed.  History of Present Illness:  Pt reports headache, ear fullness and nasal congestion. This started 2-3 days ago. The headache is located in the top of his head. He describes the pain as achy. He denies visual changes but has had some lightheadedness. He is blowing some yellow/green mucous out of his nose. He denies ear pain, drainage or loss of hearing. He denies runny nose, sore throat, loss of taste/smell, cough or SOB. He denies fever, chills or body aches. He has tried nasal Saline and Mucinex with minimal relief. He has had sick contacts, no known Covid exposure.   Past Medical History:  Diagnosis Date  . Allergy   . GERD (gastroesophageal reflux disease)    in the past  . Herniated cervical disc    surgery 2017  . Hyperlipidemia     Current Outpatient Medications  Medication Sig Dispense Refill  . aspirin EC 81 MG tablet Take 1 tablet (81 mg total) by mouth daily. 90 tablet 3  . Cholecalciferol (VITAMIN D-3) 5000 units TABS Take 5,000 Int'l Units by mouth daily.    Marland Kitchen Co-Enzyme Q-10 100 MG CAPS Take 300 mg by mouth daily.    . diazepam (VALIUM) 5 MG tablet Take 0.5-1 tablets (2.5-5 mg total) by mouth daily as needed for anxiety (for plane flights). 5 tablet 0  . Krill Oil 300 MG CAPS Take 1 capsule by mouth daily.     . metoprolol tartrate (LOPRESSOR) 50 MG tablet Take 1 tablet (50 mg total) by mouth once for 1 dose. Take 1 tablet 1 hour prior to Coronary CT 1 tablet 0  . Probiotic Product (PROBIOTIC DAILY) CAPS Take 1 capsule by mouth daily.      . rosuvastatin (CRESTOR) 10 MG tablet Take 1 tablet (10 mg total) by mouth at bedtime. 90 tablet 3   No current facility-administered medications for this visit.    Allergies  Allergen Reactions  . Cefuroxime Axetil   . Ciprofloxacin     fever  . Lipitor [Atorvastatin]     Muscle aches on 20 mg qd  . Livalo [Pitavastatin] Other (See Comments)    aches    Family History  Problem Relation Age of Onset  . Breast cancer Mother   . Uterine cancer Mother   . Coronary artery disease Father        CABG 2008  . Hyperlipidemia Father   . Bladder Cancer Father   . Coronary artery disease Other   . Hearing loss Other   . Prostate cancer Neg Hx   . Colon cancer Neg Hx   . Esophageal cancer Neg Hx   . Colon polyps Neg Hx   . Rectal cancer Neg Hx   . Stomach cancer Neg Hx     Social History   Socioeconomic History  . Marital status: Married    Spouse name: Not on file  . Number of children: 2  . Years of education: Not on file  . Highest education level: Not on file  Occupational History  . Occupation: Press photographer - Wellsite geologist:  [employer]  Tobacco Use  . Smoking status: Former Research scientist (life sciences)  . Smokeless tobacco: Never Used  . Tobacco comment: 30 yrs ago in college  Substance and Sexual Activity  . Alcohol use: Yes    Comment: 6 pack or less per week  . Drug use: No  . Sexual activity: Yes    Partners: Female  Other Topics Concern  . Not on file  Social History Narrative   HSG, [employer]. Married '92. 1 - Son '95; 1 daughter '00. 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 Difficulty of Paying Living Expenses:   Food Insecurity:   . Worried About Charity fundraiser in the Last Year:   . Arboriculturist in the Last Year:   Transportation Needs:   . Film/video editor (Medical):   Marland Kitchen Lack of Transportation (Non-Medical):    Physical Activity:   . Days of Exercise per Week:   . Minutes of Exercise per Session:   Stress:   . Feeling of Stress :   Social Connections:   . Frequency of Communication with Friends and Family:   . Frequency of Social Gatherings with Friends and Family:   . Attends Religious Services:   . Active Member of Clubs or Organizations:   . Attends Archivist Meetings:   Marland Kitchen Marital Status:   Intimate Partner Violence:   . Fear of Current or Ex-Partner:   . Emotionally Abused:   Marland Kitchen Physically Abused:   . Sexually Abused:      Constitutional: Pt reports headache. Denies fever, malaise, fatigue, or abrupt weight changes.  HEENT: Pt reports nasal congestion, ear fullness. Denies eye pain, eye redness, ear pain, ringing in the ears, wax buildup, runny nose, bloody nose, or sore throat. Respiratory: Denies difficulty breathing, shortness of breath, cough or sputum production.   Cardiovascular: Denies chest pain, chest tightness, palpitations or swelling in the hands or feet.   No other specific complaints in a complete review of systems (except as listed in HPI above).    Observations/Objective:  Temp 98.2 F (36.8 C) (Oral)   Wt Readings from Last 3 Encounters:  11/15/19 164 lb 1.9 oz (74.4 kg)  08/24/18 165 lb 8 oz (75.1 kg)  08/13/18 167 lb 3.2 oz (75.8 kg)    General: Appears his stated age, well developed, well nourished in NAD. HEENT: Head: normal shape and size;  Nose: slight congestion noted; Throat/Mouth: No hoarseness  noted.  Pulmonary/Chest: Normal effort. No respiratory distress.  Neurological: Alert and oriented.     BMET    Component Value Date/Time   NA 138 08/03/2018 1224   K 4.5 08/03/2018 1224   CL 103 08/03/2018 1224   CO2 28 08/03/2018 1224   GLUCOSE 107 (H) 08/03/2018 1224   BUN 19 08/03/2018 1224   CREATININE 1.25 08/03/2018 1224   CALCIUM 10.0 08/03/2018 1224   GFRNONAA >90 04/17/2014 1201   GFRAA >90 04/17/2014 1201    Lipid Panel      Component Value Date/Time   CHOL 182 02/15/2020 0834   CHOL 181 04/06/2015 0830   TRIG 84 02/15/2020 0834   TRIG 93 04/06/2015 0830   HDL 55 02/15/2020 0834   HDL 50 04/06/2015 0830   CHOLHDL 3.3 02/15/2020 0834   CHOLHDL 5 07/08/2017 0915   VLDL 19.8 07/08/2017 0915  LDLCALC 112 (H) 02/15/2020 0834   LDLCALC 112 (H) 04/06/2015 0830    CBC    Component Value Date/Time   WBC 7.0 08/03/2018 1224   RBC 5.40 08/03/2018 1224   HGB 16.0 08/03/2018 1224   HCT 47.2 08/03/2018 1224   PLT 186.0 08/03/2018 1224   MCV 87.3 08/03/2018 1224   MCH 29.1 04/17/2014 1152   MCHC 33.9 08/03/2018 1224   RDW 13.8 08/03/2018 1224   LYMPHSABS 1.6 08/03/2018 1224   MONOABS 0.7 08/03/2018 1224   EOSABS 0.2 08/03/2018 1224   BASOSABS 0.0 08/03/2018 1224    Hgb A1C No results found for: HGBA1C     Assessment and Plan: Allergic Rhinitis/Viral Sinusitis:  Start Zyrtec and Flonase OTC Take Ibuprofen 800 mg every 8 hours with food If no improvement by Friday, consider Pred Taper vs abx  Return precautions discussed  Follow Up Instructions:    I discussed the assessment and treatment plan with the patient. The patient was provided an opportunity to ask questions and all were answered. The patient agreed with the plan and demonstrated an understanding of the instructions.   The patient was advised to call back or seek an in-person evaluation if the symptoms worsen or if the condition fails to improve as anticipated.     Webb Silversmith, NP

## 2020-04-03 NOTE — Patient Instructions (Signed)

## 2020-07-15 ENCOUNTER — Other Ambulatory Visit: Payer: Self-pay | Admitting: Family Medicine

## 2020-07-15 DIAGNOSIS — E559 Vitamin D deficiency, unspecified: Secondary | ICD-10-CM

## 2020-07-15 DIAGNOSIS — E782 Mixed hyperlipidemia: Secondary | ICD-10-CM

## 2020-07-23 ENCOUNTER — Other Ambulatory Visit (INDEPENDENT_AMBULATORY_CARE_PROVIDER_SITE_OTHER): Payer: PRIVATE HEALTH INSURANCE

## 2020-07-23 ENCOUNTER — Other Ambulatory Visit: Payer: Self-pay

## 2020-07-23 DIAGNOSIS — E782 Mixed hyperlipidemia: Secondary | ICD-10-CM | POA: Diagnosis not present

## 2020-07-23 DIAGNOSIS — E559 Vitamin D deficiency, unspecified: Secondary | ICD-10-CM | POA: Diagnosis not present

## 2020-07-23 LAB — LIPID PANEL
Cholesterol: 215 mg/dL — ABNORMAL HIGH (ref 0–200)
HDL: 62.3 mg/dL (ref >39.00)
LDL Cholesterol: 133 mg/dL — ABNORMAL HIGH (ref 0–99)
NonHDL: 152.55
Total CHOL/HDL Ratio: 3
Triglycerides: 100 mg/dL (ref 0.0–149.0)
VLDL: 20 mg/dL (ref 0.0–40.0)

## 2020-07-23 LAB — COMPREHENSIVE METABOLIC PANEL
ALT: 42 U/L (ref 0–53)
AST: 25 U/L (ref 0–37)
Albumin: 4.7 g/dL (ref 3.5–5.2)
Alkaline Phosphatase: 48 U/L (ref 39–117)
BUN: 14 mg/dL (ref 6–23)
CO2: 25 mEq/L (ref 19–32)
Calcium: 9.7 mg/dL (ref 8.4–10.5)
Chloride: 104 mEq/L (ref 96–112)
Creatinine, Ser: 1.13 mg/dL (ref 0.40–1.50)
GFR: 66.81 mL/min (ref >60.00)
Glucose, Bld: 101 mg/dL — ABNORMAL HIGH (ref 70–99)
Potassium: 4.5 mEq/L (ref 3.5–5.1)
Sodium: 140 mEq/L (ref 135–145)
Total Bilirubin: 0.7 mg/dL (ref 0.2–1.2)
Total Protein: 7.3 g/dL (ref 6.0–8.3)

## 2020-07-23 LAB — VITAMIN D 25 HYDROXY (VIT D DEFICIENCY, FRACTURES): VITD: 36.3 ng/mL (ref 30.00–100.00)

## 2020-07-25 ENCOUNTER — Other Ambulatory Visit: Payer: PRIVATE HEALTH INSURANCE

## 2020-07-25 ENCOUNTER — Telehealth: Payer: Self-pay | Admitting: *Deleted

## 2020-07-25 ENCOUNTER — Other Ambulatory Visit: Payer: Self-pay

## 2020-07-25 DIAGNOSIS — Z20822 Contact with and (suspected) exposure to covid-19: Secondary | ICD-10-CM

## 2020-07-25 NOTE — Telephone Encounter (Signed)
Patient notified as instructed by telephone and verbalized understanding. Patient stated that he went to [facility] and was tested for covid today.. Patient stated that he should get the results back tomorrow.Patient stated that he would like to go ahead and try and do a virtual appointment tomorrow. Patient is going to check with his insurance company and see if they will cover his physical if it is done virtually. Patient stated that he will call the office back and let us know if he needs the appointment rescheduled or changed to a virtual tomorrow. Patient was advised that he needs to quarantine until he gets his covid test results back and he verbalized understanding.

## 2020-07-25 NOTE — Telephone Encounter (Signed)
Patient called stating that he has a physical scheduled with Dr. Damita Dunnings tomorrow. Patient stated that he was sneezing last night before he went to bed. Patient stated that he woke up this morning with a lot of head congestion. Patient stated that he does feel more tired today than usual. Patient stated that he has had his covid vaccine and the second one was around [place] time. Patient denies a fever, cough or chills. Patient stated that he does try to wear a mask most of the time but went out to eat yesterday and no one was wearing a mask. Patient stated that he is not sure about coming into the office tomorrow with his symptoms. Patient stated that he is to go out of town this weekend and feels that he needs to be tested for covid. Patient was given the telephone number at [facility] for testing and information on [facility] that does rapid and send off covid test. Patient stated that he will try his best to get tested today. Advised patient that a message will go back to Dr. Damita Dunnings letting him know what is going on. Patient wants to know if he should reschedule his appointment tomorrow.

## 2020-07-25 NOTE — Telephone Encounter (Signed)
Pt called back insurance will pay for virtual appointment Changed appointment to virtual

## 2020-07-25 NOTE — Telephone Encounter (Signed)
I am glad that he was vaccinated.  I think it makes sense to get tested.  We can either do the visit tomorrow virtually or reschedule it.  I am okay with whatever is best for the patient.  I think it makes sense not to come into the clinic given her current restrictions along with his symptoms.  I thanked him for calling ahead to check on this.  Thanks.

## 2020-07-26 ENCOUNTER — Encounter: Payer: Self-pay | Admitting: Family Medicine

## 2020-07-26 ENCOUNTER — Telehealth: Payer: Self-pay

## 2020-07-26 ENCOUNTER — Telehealth (INDEPENDENT_AMBULATORY_CARE_PROVIDER_SITE_OTHER): Payer: PRIVATE HEALTH INSURANCE | Admitting: Family Medicine

## 2020-07-26 ENCOUNTER — Other Ambulatory Visit: Payer: Self-pay

## 2020-07-26 VITALS — BP 135/91 | HR 70 | Temp 98.0°F | Ht 67.0 in | Wt 164.1 lb

## 2020-07-26 DIAGNOSIS — Z0000 Encounter for general adult medical examination without abnormal findings: Secondary | ICD-10-CM

## 2020-07-26 DIAGNOSIS — Z125 Encounter for screening for malignant neoplasm of prostate: Secondary | ICD-10-CM

## 2020-07-26 DIAGNOSIS — R5383 Other fatigue: Secondary | ICD-10-CM

## 2020-07-26 DIAGNOSIS — E782 Mixed hyperlipidemia: Secondary | ICD-10-CM

## 2020-07-26 LAB — NOVEL CORONAVIRUS, NAA: SARS-CoV-2, NAA: NOT DETECTED

## 2020-07-26 LAB — SARS-COV-2, NAA 2 DAY TAT

## 2020-07-26 NOTE — Progress Notes (Signed)
Interactive audio and video telecommunications were attempted between this provider and patient, however failed, due to patient having technical difficulties OR patient did not have access to video capability.  We continued and completed visit with audio only.   Virtual Visit via Telephone Note  I connected with patient on 07/26/20  at 10:08 AM  by telephone and verified that I am speaking with the correct person using two identifiers.  Location of patient: home.   Location of MD: [facility] Name of referring provider (if blank then none associated): Names per persons and role in encounter:  MD: Earlyne Iba, Patient: name listed above.    I discussed the limitations, risks, security and privacy concerns of performing an evaluation and management service by telephone and the availability of in person appointments. I also discussed with the patient that there may be a patient responsible charge related to this service. The patient expressed understanding and agreed to proceed.  CC: follow up.   History of Present Illness:   Tetanus 2011 Flu done at work yearly PNA and shingles not due.  He had covid vaccine done prev.  Colonoscopy 2018 He opted for PSA screening with next set of labs.   Living will d/w pt.  Wife designated if patient were incapacitated.   Diet and exercise d/w pt.    Elevated Cholesterol: Using medications without problems: yes Muscle aches: not with current dosing.   Diet compliance: yes Exercise: yes Lipids d/w pt.  He was inc'd from 5 to 10mg  a day.  He is taking it about ~4/7 days per week.  D/w pt about trying to get 5 doses per week and update me if he has aches.  Other labs d/w pt.  No CP, SOB, BLE edema.    Diet and exercise d/w pt.   covid testing done yesterday, results pending.  He'll update me as needed.  He had a sinus headache yesterday but sx resolved in the meantime.  He attributed it to weather change- has happened in the past.  Not a new  issue for patient.  We changed his visit to a potential virtual visit but we had changed to a phone visit due to video failure.  Dec in libido noted.  He doesn't have ED.  Stressors d/w pt.  Youngest just moved out of the house.  Pandemic stressors d/w pt.  He has a happy marriage.  He'll set up time for a date with his wife and update me as needed.  He has some fatigue.  Would not appear to be a candidate for sildenafil at this point.  Discussed.  He agrees.   Observations/Objective: nad Speech wnl.   Assessment and Plan:  Hyperlipidemia.  He'll try to inc to 5 days a week on statin.  D/w pt.  He'll update me as needed.  We can recheck his labs in about 2 months.  See orders.  Healthcare maintenance.  See plan.  Fatigue.  Likely due to schedule demands, etc. Check cbc tsh due to fatigue with next set of labs.  Follow Up Instructions: see above.    I discussed the assessment and treatment plan with the patient. The patient was provided an opportunity to ask questions and all were answered. The patient agreed with the plan and demonstrated an understanding of the instructions.   The patient was advised to call back or seek an in-person evaluation if the symptoms worsen or if the condition fails to improve as anticipated.  I provided  19 minutes of non-face-to-face time during this encounter.  Elsie Stain, MD

## 2020-07-26 NOTE — Telephone Encounter (Signed)
[facility] Night - Client Nonclinical Telephone Record AccessNurse Client [facility] Night - Client Client Site [facility] [place] Physician Renford Dills - MD Contact Type Call Who Is Calling Patient / Member / Family / Caregiver Caller Name Cameron Helin Caller Phone Number [phone] Patient Name Cameron Larsen Patient DOB 1961-02-13 Call Type Message Only Information Provided Reason for Call Request to Reschedule Office Appointment Initial Comment Caller states he would like to reschedule his physical. Additional Comment Caller would like a physical appt with the physician and not a virtual. Disp. Time Disposition Final User 07/25/2020 5:03:37 PM General Information Provided Yes Chuck Hint Call Closed By: Chuck Hint Transaction Date/Time: 07/25/2020 4:59:51 PM (ET)

## 2020-07-29 DIAGNOSIS — Z0000 Encounter for general adult medical examination without abnormal findings: Secondary | ICD-10-CM | POA: Insufficient documentation

## 2020-07-29 NOTE — Assessment & Plan Note (Signed)
Dec in libido noted.  He doesn't have ED.  Stressors d/w pt.  Youngest just moved out of the house.  Pandemic stressors d/w pt.  He has a happy marriage.  He'll set up time for a date with his wife and update me as needed.  We talked about setting time aside from his work schedule.  He has some fatigue.  Would not appear to be a candidate for sildenafil at this point.  Discussed.  He agrees.

## 2020-07-29 NOTE — Assessment & Plan Note (Signed)
  Tetanus 2011 Flu done at work yearly PNA and shingles not due.  He had covid vaccine done prev.  Colonoscopy 2018 He opted for PSA screening with next set of labs.   Living will d/w pt.  Wife designated if patient were incapacitated.   Diet and exercise d/w pt.

## 2020-07-29 NOTE — Assessment & Plan Note (Signed)
Likely due to schedule demands, etc. Check cbc tsh due to fatigue with next set of labs.

## 2020-07-29 NOTE — Assessment & Plan Note (Addendum)
Lipids d/w pt.  He was inc'd from 5 to 10mg  a day.  He is taking it about ~4/7 days per week.  D/w pt about trying to get 5 doses per week and update me if he has aches.  Other labs d/w pt.  No CP, SOB, BLE edema.    Diet and exercise d/w pt.  Routed to cardiology as FYI.

## 2020-08-08 ENCOUNTER — Other Ambulatory Visit: Payer: Self-pay | Admitting: Sleep Medicine

## 2020-08-08 ENCOUNTER — Other Ambulatory Visit: Payer: PRIVATE HEALTH INSURANCE

## 2020-08-08 DIAGNOSIS — I471 Supraventricular tachycardia: Secondary | ICD-10-CM

## 2020-08-09 LAB — NOVEL CORONAVIRUS, NAA: SARS-CoV-2, NAA: NOT DETECTED

## 2020-10-04 ENCOUNTER — Other Ambulatory Visit: Payer: Self-pay

## 2020-10-04 ENCOUNTER — Other Ambulatory Visit (INDEPENDENT_AMBULATORY_CARE_PROVIDER_SITE_OTHER): Payer: PRIVATE HEALTH INSURANCE

## 2020-10-04 DIAGNOSIS — Z125 Encounter for screening for malignant neoplasm of prostate: Secondary | ICD-10-CM | POA: Diagnosis not present

## 2020-10-04 DIAGNOSIS — R5383 Other fatigue: Secondary | ICD-10-CM | POA: Diagnosis not present

## 2020-10-04 DIAGNOSIS — E782 Mixed hyperlipidemia: Secondary | ICD-10-CM | POA: Diagnosis not present

## 2020-10-04 LAB — LIPID PANEL
Cholesterol: 264 mg/dL — ABNORMAL HIGH (ref 0–200)
HDL: 55.2 mg/dL (ref >39.00)
LDL Cholesterol: 189 mg/dL — ABNORMAL HIGH (ref 0–99)
NonHDL: 208.82
Total CHOL/HDL Ratio: 5
Triglycerides: 97 mg/dL (ref 0.0–149.0)
VLDL: 19.4 mg/dL (ref 0.0–40.0)

## 2020-10-04 LAB — HEPATIC FUNCTION PANEL
ALT: 30 U/L (ref 0–53)
AST: 22 U/L (ref 0–37)
Albumin: 4.7 g/dL (ref 3.5–5.2)
Alkaline Phosphatase: 48 U/L (ref 39–117)
Bilirubin, Direct: 0.1 mg/dL (ref 0.0–0.3)
Total Bilirubin: 0.5 mg/dL (ref 0.2–1.2)
Total Protein: 7 g/dL (ref 6.0–8.3)

## 2020-10-04 LAB — CBC WITH DIFFERENTIAL/PLATELET
Basophils Absolute: 0.1 10*3/uL (ref 0.0–0.1)
Basophils Relative: 0.8 % (ref 0.0–3.0)
Eosinophils Absolute: 0.3 10*3/uL (ref 0.0–0.7)
Eosinophils Relative: 4.1 % (ref 0.0–5.0)
HCT: 47.4 % (ref 39.0–52.0)
Hemoglobin: 15.9 g/dL (ref 13.0–17.0)
Lymphocytes Relative: 28.7 % (ref 12.0–46.0)
Lymphs Abs: 1.8 10*3/uL (ref 0.7–4.0)
MCHC: 33.6 g/dL (ref 30.0–36.0)
MCV: 87.4 fl (ref 78.0–100.0)
Monocytes Absolute: 0.7 10*3/uL (ref 0.1–1.0)
Monocytes Relative: 10.6 % (ref 3.0–12.0)
Neutro Abs: 3.5 10*3/uL (ref 1.4–7.7)
Neutrophils Relative %: 55.8 % (ref 43.0–77.0)
Platelets: 186 10*3/uL (ref 150.0–400.0)
RBC: 5.43 Mil/uL (ref 4.22–5.81)
RDW: 13.5 % (ref 11.5–15.5)
WBC: 6.2 10*3/uL (ref 4.0–10.5)

## 2020-10-04 LAB — PSA: PSA: 0.33 ng/mL (ref 0.10–4.00)

## 2020-10-04 LAB — TSH: TSH: 3.02 u[IU]/mL (ref 0.35–4.50)

## 2020-10-09 NOTE — Telephone Encounter (Signed)
Pt hd video visit on 07/26/20.

## 2020-10-24 ENCOUNTER — Other Ambulatory Visit: Payer: PRIVATE HEALTH INSURANCE

## 2020-10-24 DIAGNOSIS — Z20822 Contact with and (suspected) exposure to covid-19: Secondary | ICD-10-CM

## 2020-10-26 LAB — NOVEL CORONAVIRUS, NAA: SARS-CoV-2, NAA: NOT DETECTED

## 2020-10-26 LAB — SARS-COV-2, NAA 2 DAY TAT

## 2021-04-24 DIAGNOSIS — M542 Cervicalgia: Secondary | ICD-10-CM | POA: Insufficient documentation

## 2021-04-25 ENCOUNTER — Encounter: Payer: Self-pay | Admitting: Family Medicine

## 2021-04-25 ENCOUNTER — Ambulatory Visit: Payer: PRIVATE HEALTH INSURANCE | Admitting: Family Medicine

## 2021-04-25 ENCOUNTER — Other Ambulatory Visit: Payer: Self-pay

## 2021-04-25 VITALS — BP 132/80 | HR 93 | Temp 97.8°F | Ht 67.0 in | Wt 167.0 lb

## 2021-04-25 DIAGNOSIS — E782 Mixed hyperlipidemia: Secondary | ICD-10-CM

## 2021-04-25 DIAGNOSIS — R5383 Other fatigue: Secondary | ICD-10-CM | POA: Diagnosis not present

## 2021-04-25 NOTE — Progress Notes (Signed)
This visit occurred during the SARS-CoV-2 public health emergency.  Safety protocols were in place, including screening questions prior to the visit, additional usage of staff PPE, and extensive cleaning of exam room while observing appropriate contact time as indicated for disinfecting solutions.  He is off statin in the meantime.  D/w pt.  Stopped med in 10/2020.  He was having aches on the med.  He felt better off med.  I told him I would update cardiology.  Reason for OV- fatigue.  Generally fatigued since prev covid vaccine but clearly more fatigued in the last week.    04/17/21.  Started having HA on the top of his head.  Went for a walk that day but on lap #6 (quarter mile track) he felt weak.  No dizzy but got lightheaded.  Went home, laid down.  Then 04/20/21- felt weak while working in the yard.  Neither episode was high exertion.  He felt really fatigued at the time.  He rested and then was able to go through the day.  No chest pain.  No SOB.  No syncope.    He didn't know if he had low blood sugar- that happened in this distant past and would correct with a snack at the time.    No BLE edema.  He hadn't eaten with either episode.  Unclear about total fluid intake that day but he had been trying to drink enough water at baseline.  No sodas.    He snores some but no know apnea.  Not waking gasping for air.    No FCANVD. He had a neg recent covid test at home x2.    He had prodrome with both episodes (on the track and in the yard), and both got better with eating a snack.  No HA now.    Meds, vitals, and allergies reviewed.   ROS: Per HPI unless specifically indicated in ROS section   GEN: nad, alert and oriented HEENT: ncat, TM wnl B OP WNL MMM NECK: supple w/o LA CV: rrr.  no murmur PULM: ctab, no inc wob ABD: soft, +bs EXT: no edema SKIN: no acute rash CN 2-12 wnl B, S/S/DTR wnl x4  Note that he is not completely fasting for labs.  30 minutes were devoted to patient care  in this encounter (this includes time spent reviewing the patient's file/history, interviewing and examining the patient, counseling/reviewing plan with patient).

## 2021-04-25 NOTE — Patient Instructions (Signed)
Go to the lab on the way out.   If you have mychart we'll likely use that to update you.    Keep a snack- avoid prolonged fasting.  Drink enough water to keep your urine clear or light colored.   Take care.  Glad to see you.

## 2021-04-26 LAB — COMPREHENSIVE METABOLIC PANEL
ALT: 36 U/L (ref 0–53)
AST: 23 U/L (ref 0–37)
Albumin: 4.7 g/dL (ref 3.5–5.2)
Alkaline Phosphatase: 57 U/L (ref 39–117)
BUN: 16 mg/dL (ref 6–23)
CO2: 29 mEq/L (ref 19–32)
Calcium: 10 mg/dL (ref 8.4–10.5)
Chloride: 102 mEq/L (ref 96–112)
Creatinine, Ser: 1.2 mg/dL (ref 0.40–1.50)
GFR: 66.87 mL/min (ref >60.00)
Glucose, Bld: 96 mg/dL (ref 70–99)
Potassium: 4.2 mEq/L (ref 3.5–5.1)
Sodium: 141 mEq/L (ref 135–145)
Total Bilirubin: 0.5 mg/dL (ref 0.2–1.2)
Total Protein: 7.3 g/dL (ref 6.0–8.3)

## 2021-04-26 LAB — CBC WITH DIFFERENTIAL/PLATELET
Basophils Absolute: 0.1 10*3/uL (ref 0.0–0.1)
Basophils Relative: 0.7 % (ref 0.0–3.0)
Eosinophils Absolute: 0.2 10*3/uL (ref 0.0–0.7)
Eosinophils Relative: 2.3 % (ref 0.0–5.0)
HCT: 46.1 % (ref 39.0–52.0)
Hemoglobin: 15.7 g/dL (ref 13.0–17.0)
Lymphocytes Relative: 25.4 % (ref 12.0–46.0)
Lymphs Abs: 2 10*3/uL (ref 0.7–4.0)
MCHC: 34.1 g/dL (ref 30.0–36.0)
MCV: 87.2 fl (ref 78.0–100.0)
Monocytes Absolute: 0.8 10*3/uL (ref 0.1–1.0)
Monocytes Relative: 9.8 % (ref 3.0–12.0)
Neutro Abs: 4.8 10*3/uL (ref 1.4–7.7)
Neutrophils Relative %: 61.8 % (ref 43.0–77.0)
Platelets: 201 10*3/uL (ref 150.0–400.0)
RBC: 5.28 Mil/uL (ref 4.22–5.81)
RDW: 13.4 % (ref 11.5–15.5)
WBC: 7.8 10*3/uL (ref 4.0–10.5)

## 2021-04-26 LAB — TSH: TSH: 2.9 u[IU]/mL (ref 0.35–4.50)

## 2021-04-28 NOTE — Assessment & Plan Note (Signed)
He could not tolerate his statin in the meantime and he stopped it back in November.  I told him I will update cardiology.  I would like cardiology input.

## 2021-04-28 NOTE — Assessment & Plan Note (Signed)
Unclear source but by history it sounds like he had episodes of mild hypoglycemia.  Given the fatigue is reasonable to check baseline labs today.  Still okay for outpatient follow-up.  Broad differential diagnosis discussed with patient.  No alarming findings on exam today.  Normal neurologic exam.  Would not need imaging at this point.  See notes on labs.  He agrees with plan.

## 2021-12-04 ENCOUNTER — Telehealth: Payer: No Typology Code available for payment source | Admitting: Physician Assistant

## 2021-12-04 DIAGNOSIS — J329 Chronic sinusitis, unspecified: Secondary | ICD-10-CM

## 2021-12-04 DIAGNOSIS — B9689 Other specified bacterial agents as the cause of diseases classified elsewhere: Secondary | ICD-10-CM | POA: Diagnosis not present

## 2021-12-04 MED ORDER — DOXYCYCLINE HYCLATE 100 MG PO CAPS: 100.0000 mg | ORAL_CAPSULE | Freq: Two times a day (BID) | ORAL | 0 refills | Status: DC

## 2021-12-04 NOTE — Patient Instructions (Signed)
1. Bacterial sinusitis  - Suspect bacterial sinusitis secondary to COVID-19 infection  -Doxycycline sent to pharmacy  -Continue Flonase and Mucinex  -Primary care follow-up or urgent care follow-up for worsening symptoms

## 2021-12-04 NOTE — Progress Notes (Signed)
Mr. Cameron Larsen, ayo are scheduled for a virtual visit with your provider today.    Just as we do with appointments in the office, we must obtain your consent to participate.  Your consent will be active for this visit and any virtual visit you may have with one of our providers in the next 365 days.    If you have a MyChart account, I can also send a copy of this consent to you electronically.  All virtual visits are billed to your insurance company just like a traditional visit in the office.  As this is a virtual visit, video technology does not allow for your provider to perform a traditional examination.  This may limit your provider's ability to fully assess your condition.  If your provider identifies any concerns that need to be evaluated in person or the need to arrange testing such as labs, EKG, etc, we will make arrangements to do so.    Although advances in technology are sophisticated, we cannot ensure that it will always work on either your end or our end.  If the connection with a video visit is poor, we may have to switch to a telephone visit.  With either a video or telephone visit, we are not always able to ensure that we have a secure connection.   I need to obtain your verbal consent now.   Are you willing to proceed with your visit today?   AMED DATTA Larsen has provided verbal consent on 12/04/2021 for a virtual visit (video or telephone).   Abigail Butts, PA-C 12/04/2021  1:29 PM   Date:  12/04/2021   ID:  Cameron Larsen, DOB 08/25/1961, MRN [id number]  Patient Location: Home Provider Location: Home Office   Participants: Patient and Provider for Visit and Wrap up  Method of visit: Video  Location of Patient: Home Location of Provider: Home Office Consent was obtain for visit over the video. Services rendered by provider: Visit was performed via video  A video enabled telemedicine application was used and I verified that I am speaking with the correct  person using two identifiers.  PCP:  Tonia Ghent, MD   Chief Complaint:  Persistent congestion after COVID  History of Present Illness:    Cameron Larsen is a 60 y.o. male with history as stated below. Presents video telehealth for an acute care visit.  Pt reports 12/12 he developed URI symptoms.  Pt reports on 12/14 he tested positive for COVID.  He reports he is currently having sore throat and lymphadenopathy for the last 2 days.  {Pt reports resolution of fevers.  Reports chest is clear.  No cough or shortness of breath.  Pt reports he has persistent congestion.  Pt has been taking Mucinex, Flonase and vitamin C packs.  Pt is taking Advil for sore throat which has helped.  Pt reports no change in voice.  Pt reports sore throat is worst in the morning. Pt reports headache and sinus pressure on the right side.   No other aggravating or relieving factors.  No other c/o.  BP 145/87 with home cuff - Hx of HLD has stopped taking his medications; no hx of HTN.  Past Medical, Surgical, Social History, Allergies, and Medications have been Reviewed.  Patient Active Problem List   Diagnosis Date Noted   Neck pain 04/24/2021   Healthcare maintenance 07/29/2020   Other fatigue 08/25/2018   Atypical chest pain 08/04/2018   Vitamin D deficiency 07/23/2017  Encounter for general adult medical examination with abnormal findings 07/12/2017   Advance care planning 07/12/2017   Disequilibrium 07/12/2017   Arm paresthesia, left 07/06/2016   GERD (gastroesophageal reflux disease) 04/25/2014   Anxiety 08/10/2011   DECREASED LIBIDO 08/13/2010   Hyperlipidemia 08/27/2007    Social History   Tobacco Use   Smoking status: Former   Smokeless tobacco: Never   Tobacco comments:    30 yrs ago in college  Substance Use Topics   Alcohol use: Yes    Comment: 6 pack or less per week     Current Outpatient Medications:    doxycycline (VIBRAMYCIN) 100 MG capsule, Take 1 capsule (100 mg  total) by mouth 2 (two) times daily., Disp: 20 capsule, Rfl: 0   aspirin EC 81 MG tablet, Take 1 tablet (81 mg total) by mouth daily., Disp: 90 tablet, Rfl: 3   Cholecalciferol (VITAMIN D-3) 5000 units TABS, Take 5,000 Int'l Units by mouth daily., Disp: , Rfl:    Co-Enzyme Q-10 100 MG CAPS, Take 300 mg by mouth daily., Disp: , Rfl:    diazepam (VALIUM) 5 MG tablet, Take 0.5-1 tablets (2.5-5 mg total) by mouth daily as needed for anxiety (for plane flights)., Disp: 5 tablet, Rfl: 0   Krill Oil 300 MG CAPS, Take 1 capsule by mouth daily., Disp: , Rfl:    Probiotic Product (PROBIOTIC DAILY) CAPS, Take 1 capsule by mouth daily. , Disp: , Rfl:    rosuvastatin (CRESTOR) 10 MG tablet, Take 1 tablet (10 mg total) by mouth at bedtime. (Patient not taking: Reported on 04/25/2021), Disp: 90 tablet, Rfl: 3   Allergies  Allergen Reactions   Cefuroxime Axetil     fever   Ciprofloxacin     fever   Crestor [Rosuvastatin]     Aches at 10mg  daily   Lipitor [Atorvastatin]     Muscle aches on 20 mg qd   Livalo [Pitavastatin] Other (See Comments)    aches     Review of Systems  Constitutional:  Negative for chills and fever.  HENT:  Positive for congestion, sinus pain and sore throat. Negative for ear pain.   Eyes:  Negative for blurred vision and double vision.  Respiratory:  Negative for cough, shortness of breath and wheezing.   Cardiovascular:  Negative for chest pain, palpitations and leg swelling.  Gastrointestinal:  Negative for abdominal pain, diarrhea, nausea and vomiting.  Genitourinary:  Negative for dysuria.  Musculoskeletal:  Negative for myalgias.  Skin:  Negative for rash.  Neurological:  Positive for headaches. Negative for loss of consciousness and weakness.  Psychiatric/Behavioral:  The patient is not nervous/anxious.   See HPI for history of present illness.  Physical Exam Constitutional:      General: He is not in acute distress.    Appearance: Normal appearance. He is  well-developed. He is not ill-appearing.  HENT:     Head: Normocephalic and atraumatic.     Nose: Congestion present.  Eyes:     General: No scleral icterus.    Conjunctiva/sclera: Conjunctivae normal.  Pulmonary:     Effort: Pulmonary effort is normal.     Comments: Speaks in full sentences Musculoskeletal:        General: Normal range of motion.     Cervical back: Normal range of motion.  Skin:    Coloration: Skin is not pale.  Neurological:     General: No focal deficit present.     Mental Status: He is alert.  Psychiatric:  Mood and Affect: Mood normal.              A&P  1. Bacterial sinusitis  - Suspect bacterial sinusitis secondary to COVID-19 infection  -Doxycycline sent to pharmacy  -Continue Flonase and Mucinex  -Primary care follow-up or urgent care follow-up for worsening symptoms   Patient voiced understanding and agreement to plan.   Time:   Today, I have spent 15 minutes with the patient with telehealth technology discussing the above problems, reviewing the chart, previous notes, medications and orders.    Tests Ordered: No orders of the defined types were placed in this encounter.   Medication Changes: Meds ordered this encounter  Medications   doxycycline (VIBRAMYCIN) 100 MG capsule    Sig: Take 1 capsule (100 mg total) by mouth 2 (two) times daily.    Dispense:  20 capsule    Refill:  0     Disposition:  Follow up primary care or urgent care as needed  SignedAbigail Butts, PA-C  12/04/2021 1:40 PM

## 2022-05-05 ENCOUNTER — Other Ambulatory Visit: Payer: Self-pay | Admitting: Family Medicine

## 2022-05-05 DIAGNOSIS — E782 Mixed hyperlipidemia: Secondary | ICD-10-CM

## 2022-05-06 ENCOUNTER — Other Ambulatory Visit (INDEPENDENT_AMBULATORY_CARE_PROVIDER_SITE_OTHER): Payer: No Typology Code available for payment source

## 2022-05-06 DIAGNOSIS — E782 Mixed hyperlipidemia: Secondary | ICD-10-CM

## 2022-05-06 LAB — COMPREHENSIVE METABOLIC PANEL
ALT: 31 U/L (ref 0–53)
AST: 21 U/L (ref 0–37)
Albumin: 4.5 g/dL (ref 3.5–5.2)
Alkaline Phosphatase: 46 U/L (ref 39–117)
BUN: 14 mg/dL (ref 6–23)
CO2: 26 mEq/L (ref 19–32)
Calcium: 9.6 mg/dL (ref 8.4–10.5)
Chloride: 102 mEq/L (ref 96–112)
Creatinine, Ser: 1.14 mg/dL (ref 0.40–1.50)
GFR: 70.6 mL/min (ref >60.00)
Glucose, Bld: 100 mg/dL — ABNORMAL HIGH (ref 70–99)
Potassium: 4.4 mEq/L (ref 3.5–5.1)
Sodium: 137 mEq/L (ref 135–145)
Total Bilirubin: 0.6 mg/dL (ref 0.2–1.2)
Total Protein: 6.9 g/dL (ref 6.0–8.3)

## 2022-05-06 LAB — LIPID PANEL
Cholesterol: 239 mg/dL — ABNORMAL HIGH (ref 0–200)
HDL: 56.5 mg/dL (ref >39.00)
LDL Cholesterol: 158 mg/dL — ABNORMAL HIGH (ref 0–99)
NonHDL: 182.68
Total CHOL/HDL Ratio: 4
Triglycerides: 125 mg/dL (ref 0.0–149.0)
VLDL: 25 mg/dL (ref 0.0–40.0)

## 2022-05-12 ENCOUNTER — Encounter: Payer: Self-pay | Admitting: Family Medicine

## 2022-05-12 ENCOUNTER — Ambulatory Visit (INDEPENDENT_AMBULATORY_CARE_PROVIDER_SITE_OTHER): Payer: No Typology Code available for payment source | Admitting: Family Medicine

## 2022-05-12 ENCOUNTER — Ambulatory Visit: Payer: No Typology Code available for payment source | Admitting: Family Medicine

## 2022-05-12 VITALS — BP 140/80 | HR 88 | Temp 97.8°F | Ht 67.0 in | Wt 161.0 lb

## 2022-05-12 DIAGNOSIS — E782 Mixed hyperlipidemia: Secondary | ICD-10-CM

## 2022-05-12 DIAGNOSIS — Z0000 Encounter for general adult medical examination without abnormal findings: Secondary | ICD-10-CM | POA: Diagnosis not present

## 2022-05-12 DIAGNOSIS — Z23 Encounter for immunization: Secondary | ICD-10-CM | POA: Diagnosis not present

## 2022-05-12 DIAGNOSIS — F419 Anxiety disorder, unspecified: Secondary | ICD-10-CM

## 2022-05-12 DIAGNOSIS — Z7189 Other specified counseling: Secondary | ICD-10-CM

## 2022-05-12 MED ORDER — HYDROXYZINE HCL 10 MG PO TABS: 5.0000 mg | ORAL_TABLET | Freq: Three times a day (TID) | ORAL | 1 refills | Status: DC | PRN

## 2022-05-12 MED ORDER — EZETIMIBE 10 MG PO TABS: 10.0000 mg | ORAL_TABLET | Freq: Every day | ORAL | 3 refills | Status: DC

## 2022-05-12 NOTE — Patient Instructions (Addendum)
Call GI about follow up.   Okay to defer prostate cancer screening but also okay to consider.  If more hot flashes then let me know.  Take care.  Glad to see you.  Start zetia '10mg'$  a day and recheck labs in about 3 months.   Fasting labs visit.  Try hydroxyzine if needed for anxiety.  Let me know if that doesn't help.

## 2022-05-12 NOTE — Progress Notes (Unsigned)
CPE- See plan.  Routine anticipatory guidance given to patient.  See health maintenance.  The possibility exists that previously documented standard health maintenance information may have been brought forward from a previous encounter into this note.  If needed, that same information has been updated to reflect the current situation based on today's encounter.    Tetanus 2023 Flu done at work yearly PNA not due.  Shingles d/w pt.   Covid vaccine d/w pt.   Colonoscopy 2018- he'll call about 2023.   Prostate cancer screening and PSA options (with potential risks and benefits of testing vs not testing) were discussed along with recent recs/guidelines.  He declined testing PSA at this point. He can consider.   Living will d/w pt.  Wife designated if patient were incapacitated.   Diet and exercise d/w pt.  He is working on both.    Anxiety d/w pt.  He can get irritable at work, with stressors.  No SI/HI.  Longstanding issue.  Discussed.  See plan.  Lipids improved.  He has been working out mult times per week and feels good about it.  No CP.  Statin intolerant.  Discussed options.  He noted irregular hot flashes, lasts a few seconds to a few minutes, a few times in the last month or so.  No exertional sx.  No night sweats.  I asked him to monitor this and update me if continued.  PMH and SH reviewed  Meds, vitals, and allergies reviewed.   ROS: Per HPI.  Unless specifically indicated otherwise in HPI, the patient denies:  General: fever. Eyes: acute vision changes ENT: sore throat Cardiovascular: chest pain Respiratory: SOB GI: vomiting GU: dysuria Musculoskeletal: acute back pain Derm: acute rash Neuro: acute motor dysfunction Psych: worsening mood Endocrine: polydipsia Heme: bleeding Allergy: hayfever  GEN: nad, alert and oriented HEENT: ncat NECK: supple w/o LA CV: rrr. PULM: ctab, no inc wob ABD: soft, +bs EXT: no edema SKIN: no acute rash

## 2022-05-14 NOTE — Assessment & Plan Note (Signed)
He can try hydroxyzine if needed for anxiety.  He can let me know if that doesn't help.  Okay for outpatient follow-up.

## 2022-05-14 NOTE — Assessment & Plan Note (Addendum)
Statin intolerant Start zetia '10mg'$  a day and recheck labs in about 3 months.   Fasting labs visit.  Continue work on diet and exercise.

## 2022-05-14 NOTE — Assessment & Plan Note (Signed)
Living will d/w pt.  Wife designated if patient were incapacitated.   ?

## 2022-05-14 NOTE — Assessment & Plan Note (Signed)
  Tetanus 2023 Flu done at work yearly PNA not due.  Shingles d/w pt.   Covid vaccine d/w pt.   Colonoscopy 2018- he'll call about 2023.   Prostate cancer screening and PSA options (with potential risks and benefits of testing vs not testing) were discussed along with recent recs/guidelines.  He declined testing PSA at this point. He can consider.   Living will d/w pt.  Wife designated if patient were incapacitated.   Diet and exercise d/w pt.  He is working on both.

## 2022-08-06 ENCOUNTER — Encounter: Payer: Self-pay | Admitting: Gastroenterology

## 2022-09-04 ENCOUNTER — Ambulatory Visit (AMBULATORY_SURGERY_CENTER): Payer: Self-pay | Admitting: *Deleted

## 2022-09-04 VITALS — Ht 67.0 in | Wt 166.4 lb

## 2022-09-04 DIAGNOSIS — Z86010 Personal history of colonic polyps: Secondary | ICD-10-CM

## 2022-09-04 MED ORDER — NA SULFATE-K SULFATE-MG SULF 17.5-3.13-1.6 GM/177ML PO SOLN: 1.0000 | Freq: Once | ORAL | 0 refills | Status: AC

## 2022-09-04 NOTE — Progress Notes (Signed)
No egg or soy allergy known to patient  No issues known to pt with past sedation with any surgeries or procedures Patient denies ever being told they had issues or difficulty with intubation  No FH of Malignant Hyperthermia Pt is not on diet pills Pt is not on home 02  Pt is not on blood thinners  Pt denies issues with constipation  No A fib or A flutter Have any cardiac testing pending--NO Pt instructed to use [email or web address] or GoodRx for a price reduction on prep   

## 2022-09-08 ENCOUNTER — Encounter: Payer: Self-pay | Admitting: Family Medicine

## 2022-09-10 ENCOUNTER — Other Ambulatory Visit: Payer: Self-pay | Admitting: Family Medicine

## 2022-09-10 DIAGNOSIS — Z125 Encounter for screening for malignant neoplasm of prostate: Secondary | ICD-10-CM

## 2022-09-10 NOTE — Telephone Encounter (Signed)
Patient called in to follow up on this. Stated he isn't taking the medication and concerned the lab visits will not be accurate if he waits any longer.

## 2022-09-20 ENCOUNTER — Encounter: Payer: Self-pay | Admitting: Certified Registered Nurse Anesthetist

## 2022-09-25 ENCOUNTER — Encounter: Payer: Self-pay | Admitting: Gastroenterology

## 2022-09-25 ENCOUNTER — Ambulatory Visit (AMBULATORY_SURGERY_CENTER): Payer: No Typology Code available for payment source | Admitting: Gastroenterology

## 2022-09-25 VITALS — BP 110/69 | HR 72 | Temp 98.6°F | Resp 12 | Ht 67.0 in | Wt 166.4 lb

## 2022-09-25 DIAGNOSIS — Z86010 Personal history of colonic polyps: Secondary | ICD-10-CM

## 2022-09-25 DIAGNOSIS — Z09 Encounter for follow-up examination after completed treatment for conditions other than malignant neoplasm: Secondary | ICD-10-CM

## 2022-09-25 MED ORDER — SODIUM CHLORIDE 0.9 % IV SOLN: 500.0000 mL | INTRAVENOUS | Status: AC

## 2022-09-25 NOTE — Progress Notes (Signed)
[facility] History and Physical   Primary Care Physician:  Tonia Ghent, MD   Reason for Procedure:   History of colon polyps  Plan:    colonoscopy     HPI: Cameron Larsen is a 61 y.o. male  here for colonoscopy surveillance - advanced SSP removed in 2015, last exam 2018 with one small adenoma. Patient denies any bowel symptoms at this time. No family history of colon cancer known. Otherwise feels well without any cardiopulmonary symptoms.   I have discussed risks / benefits of anesthesia and endoscopic procedure with Cameron Larsen and they wish to proceed with the exams as outlined today.    Past Medical History:  Diagnosis Date   Allergy    SEASONAL   GERD (gastroesophageal reflux disease)    in the past   Herniated cervical disc    surgery 2017   Hyperlipidemia     Past Surgical History:  Procedure Laterality Date   CERVICAL FUSION  2017   COLONOSCOPY     DENTAL SURGERY     POLYPECTOMY      Prior to Admission medications   Medication Sig Start Date End Date Taking? Authorizing Provider  aspirin EC 81 MG tablet Take 1 tablet (81 mg total) by mouth daily. 11/15/19  Yes Jerline Pain, MD  Cholecalciferol (VITAMIN D-3) 5000 units TABS Take 5,000 Int'l Units by mouth daily. 08/24/18  Yes Tonia Ghent, MD  Co-Enzyme Q-10 100 MG CAPS Take 300 mg by mouth daily.   Yes [provider]  ezetimibe (ZETIA) 10 MG tablet Take 1 tablet (10 mg total) by mouth daily. 05/12/22  Yes Tonia Ghent, MD  Multiple Vitamin (MULTIVITAMIN ADULT PO) Take by mouth daily. CENTRUM SILVER,50 +   Yes [provider]  Probiotic Product (PROBIOTIC DAILY PO) Take by mouth.   Yes [provider]  diazepam (VALIUM) 5 MG tablet Take 0.5-1 tablets (2.5-5 mg total) by mouth daily as needed for anxiety (for plane flights). Patient not taking: Reported on 09/04/2022 10/17/18   Tonia Ghent, MD  hydrOXYzine (ATARAX) 10 MG tablet Take 0.5-1  tablets (5-10 mg total) by mouth 3 (three) times daily as needed for anxiety. Patient not taking: Reported on 09/04/2022 05/12/22   Tonia Ghent, MD  Javier Docker Oil 300 MG CAPS Take 1 capsule by mouth daily. Patient not taking: Reported on 09/04/2022    [provider]  Probiotic Product (PROBIOTIC DAILY) CAPS Take 1 capsule by mouth daily.  Patient not taking: Reported on 09/04/2022    [provider]    Current Outpatient Medications  Medication Sig Dispense Refill   aspirin EC 81 MG tablet Take 1 tablet (81 mg total) by mouth daily. 90 tablet 3   Cholecalciferol (VITAMIN D-3) 5000 units TABS Take 5,000 Int'l Units by mouth daily.     Co-Enzyme Q-10 100 MG CAPS Take 300 mg by mouth daily.     ezetimibe (ZETIA) 10 MG tablet Take 1 tablet (10 mg total) by mouth daily. 90 tablet 3   Multiple Vitamin (MULTIVITAMIN ADULT PO) Take by mouth daily. CENTRUM SILVER,50 +     Probiotic Product (PROBIOTIC DAILY PO) Take by mouth.     diazepam (VALIUM) 5 MG tablet Take 0.5-1 tablets (2.5-5 mg total) by mouth daily as needed for anxiety (for plane flights). (Patient not taking: Reported on 09/04/2022) 5 tablet 0   hydrOXYzine (ATARAX) 10 MG tablet Take 0.5-1 tablets (5-10 mg total) by mouth  3 (three) times daily as needed for anxiety. (Patient not taking: Reported on 09/04/2022) 30 tablet 1   Krill Oil 300 MG CAPS Take 1 capsule by mouth daily. (Patient not taking: Reported on 09/04/2022)     Probiotic Product (PROBIOTIC DAILY) CAPS Take 1 capsule by mouth daily.  (Patient not taking: Reported on 09/04/2022)     Current Facility-Administered Medications  Medication Dose Route Frequency Provider Last Rate Last Admin   0.9 %  sodium chloride infusion  500 mL Intravenous Continuous [place] Gustafson, Carlota Raspberry, MD        Allergies as of 09/25/2022 - Review Complete 09/25/2022  Allergen Reaction Noted   Cefuroxime axetil     Ciprofloxacin     Crestor [rosuvastatin]  04/25/2021   Lipitor [atorvastatin]   07/25/2014   Livalo [pitavastatin] Other (See Comments) 07/04/2016    Family History  Problem Relation Age of Onset   Breast cancer Mother    Uterine cancer Mother    Coronary artery disease Father        CABG 2008   Hyperlipidemia Father    Bladder Cancer Father    Coronary artery disease Other    Hearing loss Other    Prostate cancer Neg Hx    Colon cancer Neg Hx    Esophageal cancer Neg Hx    Colon polyps Neg Hx    Rectal cancer Neg Hx    Stomach cancer Neg Hx    Crohn's disease Neg Hx    Ulcerative colitis Neg Hx     Social History   Socioeconomic History   Marital status: Married    Spouse name: Not on file   Number of children: 2   Years of education: Not on file   Highest education level: Not on file  Occupational History   Occupation: Press photographer - Wellsite geologist: [employer]  Tobacco Use   Smoking status: Former    Passive exposure: Never   Smokeless tobacco: Never   Tobacco comments:    30 yrs ago in college  Vaping Use   Vaping Use: Never used  Substance and Sexual Activity   Alcohol use: Yes    Comment: 6 pack or less per week   Drug use: No   Sexual activity: Yes    Partners: Female  Other Topics Concern   Not on file  Social History Narrative   HS[employer], [employer]. Married '92. 1 - Son '95; 1 daughter '00. 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  Physical Exam: Vital signs BP 122/81   Pulse 77   Temp 98.6 F (37 C)   Ht '5\' 7"'$  (1.702 m)   Wt 166 lb 6.4 oz (75.5 kg)   SpO2 97%   BMI 26.06 kg/m   General:   Alert,   Well-developed, pleasant and cooperative in NAD Lungs:  Clear throughout to auscultation.   Heart:  Regular rate and rhythm Abdomen:  Soft, nontender and nondistended.   Neuro/Psych:  Alert and cooperative. Normal mood and affect. A  and O x 3  Jolly Mango, MD [facility]

## 2022-09-25 NOTE — Patient Instructions (Signed)
HANDOUTS PROVIDED ON: HEMORRHOIDS  Your next colonoscopy should occur in 10 years.    You may resume your previous diet and medication schedule.  Thank you for allowing [place] to care for you today!!!   YOU HAD AN ENDOSCOPIC PROCEDURE TODAY AT [facility]:   Refer to the procedure report that was given to you for any specific questions about what was found during the examination.  If the procedure report does not answer your questions, please call your gastroenterologist to clarify.  If you requested that your care partner not be given the details of your procedure findings, then the procedure report has been included in a sealed envelope for you to review at your convenience later.  YOU SHOULD EXPECT: Some feelings of bloating in the abdomen. Passage of more gas than usual.  Walking can help get rid of the air that was put into your GI tract during the procedure and reduce the bloating. If you had a lower endoscopy (such as a colonoscopy or flexible sigmoidoscopy) you may notice spotting of blood in your stool or on the toilet paper. If you underwent a bowel prep for your procedure, you may not have a normal bowel movement for a few days.  Please Note:  You might notice some irritation and congestion in your nose or some drainage.  This is from the oxygen used during your procedure.  There is no need for concern and it should clear up in a day or so.  SYMPTOMS TO REPORT IMMEDIATELY:  Following lower endoscopy (colonoscopy or flexible sigmoidoscopy):  Excessive amounts of blood in the stool  Significant tenderness or worsening of abdominal pains  Swelling of the abdomen that is new, acute  Fever of 100F or higher  For urgent or emergent issues, a gastroenterologist can be reached at any hour by calling [phone]. Do not use MyChart messaging for urgent concerns.    DIET:  We do recommend a small meal at first, but then you may proceed to your regular diet.  Drink plenty  of fluids but you should avoid alcoholic beverages for 24 hours.  ACTIVITY:  You should plan to take it easy for the rest of today and you should NOT DRIVE or use heavy machinery until tomorrow (because of the sedation medicines used during the test).    FOLLOW UP: Our staff will call the number listed on your records the next business day following your procedure.  We will call around 7:15- 8:00 am to check on you and address any questions or concerns that you may have regarding the information given to you following your procedure. If we do not reach you, we will leave a message.     If any biopsies were taken you will be contacted by phone or by letter within the next 1-3 weeks.  Please call us at [phone] if you have not heard about the biopsies in 3 weeks.    SIGNATURES/CONFIDENTIALITY: You and/or your care partner have signed paperwork which will be entered into your electronic medical record.  These signatures attest to the fact that that the information above on your After Visit Summary has been reviewed and is understood.  Full responsibility of the confidentiality of this discharge information lies with you and/or your care-partner.

## 2022-09-25 NOTE — Op Note (Signed)
[facility] Patient Name: Cameron Larsen Procedure Date: 09/25/2022 8:58 AM MRN: [id number] Endoscopist: Remo Lipps P. Havery Moros , MD Age: 61 Referring MD:  Date of Birth: 1961-06-06 Gender: Male Account #: [id number] Procedure:                Colonoscopy Indications:              High risk colon cancer surveillance: Personal                            history of colonic polyps - history of 2cm sessile                            serrated polyp removed in 2015, and 1 diminutive                            adenoma removed 2018 Medicines:                Monitored Anesthesia Care Procedure:                Pre-Anesthesia Assessment:                           - Prior to the procedure, a History and Physical                            was performed, and patient medications and                            allergies were reviewed. The patient's tolerance of                            previous anesthesia was also reviewed. The risks                            and benefits of the procedure and the sedation                            options and risks were discussed with the patient.                            All questions were answered, and informed consent                            was obtained. Prior Anticoagulants: The patient has                            taken no previous anticoagulant or antiplatelet                            agents. ASA Grade Assessment: II - A patient with                            mild systemic disease. After reviewing the risks  and benefits, the patient was deemed in                            satisfactory condition to undergo the procedure.                           After obtaining informed consent, the colonoscope                            was passed under direct vision. Throughout the                            procedure, the patient's blood pressure, pulse, and                            oxygen saturations were monitored  continuously. The                            Olympus CF-HQ190L ([id number]) Colonoscope was                            introduced through the anus and advanced to the the                            cecum, identified by appendiceal orifice and                            ileocecal valve. The colonoscopy was performed                            without difficulty. The patient tolerated the                            procedure well. The quality of the bowel                            preparation was good. The ileocecal valve,                            appendiceal orifice, and rectum were photographed. Scope In: 9:11:04 AM Scope Out: 9:27:51 AM Scope Withdrawal Time: 0 hours 14 minutes 10 seconds  Total Procedure Duration: 0 hours 16 minutes 47 seconds  Findings:                 The perianal and digital rectal examinations were                            normal.                           Internal hemorrhoids were found during                            retroflexion. The hemorrhoids were small.  The exam was otherwise without abnormality. Complications:            No immediate complications. Estimated blood loss:                            None. Estimated Blood Loss:     Estimated blood loss: none. Impression:               - Internal hemorrhoids.                           - The examination was otherwise normal.                           - No polyps Recommendation:           - Patient has a contact number available for                            emergencies. The signs and symptoms of potential                            delayed complications were discussed with the                            patient. Return to normal activities tomorrow.                            Written discharge instructions were provided to the                            patient.                           - Resume previous diet.                           - Continue present medications.                            - Repeat colonoscopy in 10 years for surveillance                            given no significant polyps on the last 2 exams Keilan Nichol P. Demoni Parmar, MD 09/25/2022 9:33:58 AM This report has been signed electronically.

## 2022-09-25 NOTE — Progress Notes (Signed)
Report given to PACU, vss 

## 2022-09-26 ENCOUNTER — Telehealth: Payer: Self-pay

## 2022-09-26 NOTE — Telephone Encounter (Signed)
  Follow up Call-     09/25/2022    8:14 AM  Call back number  Post procedure Call Back phone  # [phone]  Permission to leave phone message Yes     Patient questions:  Do you have a fever, pain , or abdominal swelling? No. Pain Score  0 *  Have you tolerated food without any problems? Yes.    Have you been able to return to your normal activities? Yes.    Do you have any questions about your discharge instructions: Diet   No. Medications  No. Follow up visit  No.  Do you have questions or concerns about your Care? No.  Actions: * If pain score is 4 or above: No action needed, pain <4.

## 2022-10-09 ENCOUNTER — Other Ambulatory Visit (INDEPENDENT_AMBULATORY_CARE_PROVIDER_SITE_OTHER): Payer: No Typology Code available for payment source

## 2022-10-09 DIAGNOSIS — E782 Mixed hyperlipidemia: Secondary | ICD-10-CM

## 2022-10-09 DIAGNOSIS — Z125 Encounter for screening for malignant neoplasm of prostate: Secondary | ICD-10-CM | POA: Diagnosis not present

## 2022-10-09 LAB — COMPREHENSIVE METABOLIC PANEL
ALT: 42 U/L (ref 0–53)
AST: 26 U/L (ref 0–37)
Albumin: 4.7 g/dL (ref 3.5–5.2)
Alkaline Phosphatase: 52 U/L (ref 39–117)
BUN: 21 mg/dL (ref 6–23)
CO2: 29 mEq/L (ref 19–32)
Calcium: 9.5 mg/dL (ref 8.4–10.5)
Chloride: 102 mEq/L (ref 96–112)
Creatinine, Ser: 1.21 mg/dL (ref 0.40–1.50)
GFR: 65.53 mL/min (ref >60.00)
Glucose, Bld: 94 mg/dL (ref 70–99)
Potassium: 4.5 mEq/L (ref 3.5–5.1)
Sodium: 139 mEq/L (ref 135–145)
Total Bilirubin: 0.5 mg/dL (ref 0.2–1.2)
Total Protein: 7.3 g/dL (ref 6.0–8.3)

## 2022-10-09 LAB — LIPID PANEL
Cholesterol: 245 mg/dL — ABNORMAL HIGH (ref 0–200)
HDL: 55.3 mg/dL (ref >39.00)
LDL Cholesterol: 174 mg/dL — ABNORMAL HIGH (ref 0–99)
NonHDL: 189.9
Total CHOL/HDL Ratio: 4
Triglycerides: 78 mg/dL (ref 0.0–149.0)
VLDL: 15.6 mg/dL (ref 0.0–40.0)

## 2022-10-09 LAB — PSA: PSA: 0.3 ng/mL (ref 0.10–4.00)

## 2022-10-21 ENCOUNTER — Telehealth: Payer: Self-pay | Admitting: Family Medicine

## 2022-10-21 NOTE — Telephone Encounter (Signed)
Patient called and stated he was returning [place] call

## 2022-10-21 NOTE — Telephone Encounter (Signed)
Called and spoke with patient. See result note for further documentation.

## 2022-10-22 ENCOUNTER — Other Ambulatory Visit: Payer: Self-pay | Admitting: Family Medicine

## 2022-10-22 DIAGNOSIS — E782 Mixed hyperlipidemia: Secondary | ICD-10-CM

## 2022-10-28 ENCOUNTER — Ambulatory Visit: Payer: No Typology Code available for payment source | Attending: Internal Medicine | Admitting: Internal Medicine

## 2022-10-28 ENCOUNTER — Telehealth: Payer: Self-pay | Admitting: Internal Medicine

## 2022-10-28 ENCOUNTER — Encounter: Payer: Self-pay | Admitting: Internal Medicine

## 2022-10-28 VITALS — BP 126/78 | HR 74 | Ht 67.0 in | Wt 167.8 lb

## 2022-10-28 DIAGNOSIS — E782 Mixed hyperlipidemia: Secondary | ICD-10-CM | POA: Diagnosis not present

## 2022-10-28 NOTE — Telephone Encounter (Signed)
PA for Repatha submitted via CMM (Key: [id number])

## 2022-10-28 NOTE — Progress Notes (Signed)
LIPID CLINIC CONSULT NOTE  Chief Complaint:  Manage dyslipidemia  Primary Care Physician: Cameron Ghent, MD  Primary Cardiologist:  None  HPI:  Cameron Larsen is a 61 y.o. male who is being seen today for the evaluation of dyslipidemia at the request of Cameron Larsen, Cameron Rising, MD. this is a pleasant 61 year old male who is kindly referred for evaluation management of dyslipidemia.  He had previously seen Dr. Marlou Larsen back in 2020.  He had work-up for chest pain including coronary CT angiography which showed a calcium score of 208, 88th percentile for age and sex matched controls.  There was some mild stenosis of the proximal LAD and circumflex but they were not flow-limiting.  Unfortunately, he has been statin intolerant having taken rosuvastatin, atorvastatin, lovastatin and Livalo, all of which caused myalgias.  He has more recently been on ezetimibe.  He states that even on ezetimibe he says his cholesterol has gone up somewhat.  He has been a little less active and he reports his diet is variable but generally pretty good avoiding saturated fats, fast foods and fried foods.  His most recent lipid profile showed total cholesterol 245, triglycerides 78, HDL 55 and LDL 174.  He reports a strong family history of heart disease including his father who had bypass surgery at a younger age and his grandfather on his father side.  Lipids are concerning for probable familial hyperlipidemia when you consider his LDL off therapy to be much higher.  PMHx:  Past Medical History:  Diagnosis Date   Allergy    SEASONAL   GERD (gastroesophageal reflux disease)    in the past   Herniated cervical disc    surgery 2017   Hyperlipidemia     Past Surgical History:  Procedure Laterality Date   CERVICAL FUSION  2017   COLONOSCOPY     DENTAL SURGERY     POLYPECTOMY      FAMHx:  Family History  Problem Relation Age of Onset   Breast cancer Mother    Uterine cancer Mother    Coronary artery  disease Father        CABG 2008   Hyperlipidemia Father    Bladder Cancer Father    Coronary artery disease Other    Hearing loss Other    Prostate cancer Neg Hx    Colon cancer Neg Hx    Esophageal cancer Neg Hx    Colon polyps Neg Hx    Rectal cancer Neg Hx    Stomach cancer Neg Hx    Crohn's disease Neg Hx    Ulcerative colitis Neg Hx     SOCHx:   reports that he has quit smoking. He has never been exposed to tobacco smoke. He has never used smokeless tobacco. He reports current alcohol use. He reports that he does not use drugs.  ALLERGIES:  Allergies  Allergen Reactions   Cefuroxime Axetil     fever   Ciprofloxacin     fever   Crestor [Rosuvastatin]     Aches at '10mg'$  daily   Lipitor [Atorvastatin]     Muscle aches on 20 mg qd   Livalo [Pitavastatin] Other (See Comments)    aches    ROS: Pertinent items noted in HPI and remainder of comprehensive ROS otherwise negative.  HOME MEDS: Current Outpatient Medications on File Prior to Visit  Medication Sig Dispense Refill   aspirin EC 81 MG tablet Take 1 tablet (81 mg total) by mouth daily. 90 tablet  3   Cholecalciferol (VITAMIN D-3) 5000 units TABS Take 5,000 Int'l Units by mouth daily.     Co-Enzyme Q-10 100 MG CAPS Take 300 mg by mouth daily.     diazepam (VALIUM) 5 MG tablet Take 0.5-1 tablets (2.5-5 mg total) by mouth daily as needed for anxiety (for plane flights). 5 tablet 0   ezetimibe (ZETIA) 10 MG tablet Take 1 tablet (10 mg total) by mouth daily. 90 tablet 3   hydrOXYzine (ATARAX) 10 MG tablet Take 0.5-1 tablets (5-10 mg total) by mouth 3 (three) times daily as needed for anxiety. 30 tablet 1   Krill Oil 300 MG CAPS Take 1 capsule by mouth daily.     Multiple Vitamin (MULTIVITAMIN ADULT PO) Take by mouth daily. CENTRUM SILVER,50 +     Probiotic Product (PROBIOTIC DAILY PO) Take by mouth.     Probiotic Product (PROBIOTIC DAILY) CAPS Take 1 capsule by mouth daily.     Current Facility-Administered Medications  on File Prior to Visit  Medication Dose Route Frequency Provider Last Rate Last Admin   0.9 %  sodium chloride infusion  500 mL Intravenous Continuous Larsen, Cameron Raspberry, MD        LABS/IMAGING: No results found for this or any previous visit (from the past 48 hour(s)). No results found.  LIPID PANEL:    Component Value Date/Time   CHOL 245 (H) 10/09/2022 1004   CHOL 182 02/15/2020 0834   CHOL 181 04/06/2015 0830   TRIG 78.0 10/09/2022 1004   TRIG 93 04/06/2015 0830   HDL 55.30 10/09/2022 1004   HDL 55 02/15/2020 0834   HDL 50 04/06/2015 0830   CHOLHDL 4 10/09/2022 1004   VLDL 15.6 10/09/2022 1004   LDLCALC 174 (H) 10/09/2022 1004   LDLCALC 112 (H) 02/15/2020 0834   LDLCALC 112 (H) 04/06/2015 0830   LDLDIRECT 176.2 08/06/2010 0917    WEIGHTS: Wt Readings from Last 3 Encounters:  10/28/22 167 lb 12.8 oz (76.1 kg)  09/25/22 166 lb 6.4 oz (75.5 kg)  09/04/22 166 lb 6.4 oz (75.5 kg)    VITALS: BP 126/78 (BP Location: Left Arm, Patient Position: Sitting)   Pulse 74   Ht '5\' 7"'$  (1.702 m)   Wt 167 lb 12.8 oz (76.1 kg)   SpO2 95%   BMI 26.28 kg/m   EXAM: Deferred  EKG: Deferred  ASSESSMENT: Probable familial hyperlipidemia Family history of high cholesterol and heart disease in his father and grandfather Mild nonobstructive coronary disease with CAC score of 208, 88th percentile for age and sex matched controls (2020) Statin intolerant-myalgias  PLAN: 1.   Cameron Larsen has a probable familial hyperlipidemia based on Simon Broome criteria.  His father and paternal grandfather all had increased cholesterol and heart disease which was early onset.  He also has early onset heart disease based on CT in 2020 which was age advanced.  Unfortunately he has not been able to tolerate statins due to myalgias and is tried 4.  He is currently on ezetimibe.  He needs additional lipid lowering for at least 50% reduction.  I like to start a PCSK9 antibody inhibitor.  We will plan to  check a lipid NMR and an LP(a) in about 3 to 4 months and follow-up after he starts on therapy.  Thanks again for the kind referral.  Cameron Casino, MD, FACC, [place] Director of the [facility] Lipid [facility] &  Cardiovascular Risk Reduction [facility] Diplomate of the  American Board of Clinical Lipidology Attending Cardiologist  Direct Dial: [phone]  Fax: [phone]  Website:  [email or web address] 10/28/2022, 8:50 AM

## 2022-10-28 NOTE — Patient Instructions (Signed)
Medication Instructions:  Dr. Debara Pickett recommends Repatha [place] (PCSK9). This is an injectable cholesterol medication self-administered once every 14 days. This medication will likely need prior approval with your insurance company, which we will work on. If the medication is not approved initially, we may need to do an appeal with your insurance.   Administer medication in area of fatty tissue such as abdomen, outer thigh, back of upper arm - and rotate site with each injection Store medication in refrigerator until ready to administer - allow to sit at room temp for 30 mins - 1 hour prior to injection Dispose of medication in a SHARPS container - your pharmacy should be able to direct you on this and proper disposal   If you need a co-pay card for Repatha: MicroLists.at If you need a co-pay card for Praluent: [email or web address]://[email or web address]  Patient Assistance:    These foundations have funds at various times.   The PAN Foundation: https://www.panfoundation.org/disease-funds/hypercholesterolemia/ -- can sign up for wait list  The [facility] offers assistance to help pay for medication copays.  They will cover copays for all cholesterol lowering meds, including statins, fibrates, omega-3 fish oils like Vascepa, ezetimibe, Repatha, Praluent, Nexletol, Nexlizet.  The cards are usually good for $2,500 or 12 months, whichever comes first. Go to healthwellfoundation.org Click on "Apply Now" Answer questions as to whom is applying (patient or representative) Your disease fund will be "hypercholesterolemia - Medicare access" They will ask questions about finances and which medications you are taking for cholesterol When you submit, the approval is usually within minutes.  You will need to print the card information from the site You will need to show this information to your pharmacy, they will bill your Medicare Part D plan first -then  bill Health Well --for the copay.   You can also call them at [phone], although the hold times can be quite long.     *If you need a refill on your cardiac medications before your next appointment, please call your pharmacy*   Lab Work: FASTING lab work to check cholesterol in 3-4 months  If you have labs (blood work) drawn today and your tests are completely normal, you will receive your results only by: [place] (if you have MyChart) OR A paper copy in the mail If you have any lab test that is abnormal or we need to change your treatment, we will call you to review the results.   Follow-Up: At [facility], you and your health needs are our priority.  As part of our continuing mission to provide you with exceptional heart care, we have created designated Provider Care Teams.  These Care Teams include your primary Cardiologist (physician) and Advanced Practice Providers (APPs -  Physician Assistants and Nurse Practitioners) who all work together to provide you with the care you need, when you need it.  We recommend signing up for the patient portal called "MyChart".  Sign up information is provided on this After Visit Summary.  MyChart is used to connect with patients for Virtual Visits (Telemedicine).  Patients are able to view lab/test results, encounter notes, upcoming appointments, etc.  Non-urgent messages can be sent to your provider as well.   To learn more about what you can do with MyChart, go to [email or web address].    Your next appointment:    3-4 months with Dr. Debara Pickett

## 2022-11-04 NOTE — Telephone Encounter (Signed)
This medication or product is on your plan's list of covered drugs. Prior authorization is not required at this time. If your pharmacy has questions regarding the processing of your prescription, please have them call the [facility] pharmacy help desk at [phone]. **Please note: This request was submitted electronically. Formulary lowering, tiering exception, cost reduction and/or pre-benefit determination review (including prospective Medicare hospice reviews) requests cannot be requested using this method of submission. Providers contact us at [phone] for further assistance.

## 2022-11-06 MED ORDER — REPATHA SURECLICK 140 MG/ML ~~LOC~~ SOAJ: 1.0000 mL | SUBCUTANEOUS | 3 refills | Status: DC

## 2022-11-06 NOTE — Addendum Note (Signed)
Addended by: Fidel Levy on: 11/06/2022 07:47 AM   Modules accepted: Orders

## 2022-11-06 NOTE — Telephone Encounter (Signed)
Rx sent to [facility] per patient request

## 2023-01-30 LAB — NMR, LIPOPROFILE
Cholesterol, Total: 120 mg/dL (ref 100–199)
HDL Particle Number: 41.8 umol/L (ref >=30.5)
HDL-C: 58 mg/dL (ref >39)
LDL Particle Number: 588 nmol/L (ref <1000)
LDL Size: 20.1 nm — ABNORMAL LOW (ref >20.5)
LDL-C (NIH Calc): 48 mg/dL (ref 0–99)
LP-IR Score: 47 — ABNORMAL HIGH (ref <=45)
Small LDL Particle Number: 287 nmol/L (ref <=527)
Triglycerides: 70 mg/dL (ref 0–149)

## 2023-01-30 LAB — LIPOPROTEIN A (LPA): Lipoprotein (a): 83.6 nmol/L — ABNORMAL HIGH (ref <75.0)

## 2023-02-05 ENCOUNTER — Encounter: Payer: Self-pay | Admitting: Internal Medicine

## 2023-02-05 ENCOUNTER — Ambulatory Visit: Payer: No Typology Code available for payment source | Attending: Internal Medicine | Admitting: Internal Medicine

## 2023-02-05 VITALS — BP 151/84 | HR 74 | Ht 67.0 in | Wt 168.2 lb

## 2023-02-05 DIAGNOSIS — E785 Hyperlipidemia, unspecified: Secondary | ICD-10-CM | POA: Diagnosis not present

## 2023-02-05 DIAGNOSIS — R0681 Apnea, not elsewhere classified: Secondary | ICD-10-CM

## 2023-02-05 DIAGNOSIS — R931 Abnormal findings on diagnostic imaging of heart and coronary circulation: Secondary | ICD-10-CM

## 2023-02-05 DIAGNOSIS — Z9189 Other specified personal risk factors, not elsewhere classified: Secondary | ICD-10-CM

## 2023-02-05 DIAGNOSIS — G4719 Other hypersomnia: Secondary | ICD-10-CM | POA: Diagnosis not present

## 2023-02-05 DIAGNOSIS — R0683 Snoring: Secondary | ICD-10-CM

## 2023-02-05 MED ORDER — REPATHA SURECLICK 140 MG/ML ~~LOC~~ SOAJ: 1.0000 mL | SUBCUTANEOUS | 3 refills | Status: DC

## 2023-02-05 NOTE — Patient Instructions (Signed)
Medication Instructions:  NO CHANGES  *If you need a refill on your cardiac medications before your next appointment, please call your pharmacy*   Lab Work: FASTING lab work in 1 year   If you have labs (blood work) drawn today and your tests are completely normal, you will receive your results only by: [place] (if you have MyChart) OR A paper copy in the mail If you have any lab test that is abnormal or we need to change your treatment, we will call you to review the results.   Testing/Procedures: WatchPAT?  Is a FDA cleared portable home sleep study test that uses a watch and 3 points of contact to monitor 7 different channels, including your heart rate, oxygen saturations, body position, snoring, and chest motion.  The study is easy to use from the comfort of your own home and accurately detect sleep apnea.  Before bed, you attach the chest sensor, attached the sleep apnea bracelet to your nondominant hand, and attach the finger probe.  After the study, the raw data is downloaded from the watch and scored for apnea events.   For more information: [email or web address]  Patient Testing Instructions:  Do not put battery into the device until bedtime when you are ready to begin the test. Please call the support number if you need assistance after following the instructions below: 24 hour support line- [phone] or ITAMAR support at [phone] (option 2)  Download the [employer] AmericanWatchPAT One" app through the google play store or App Store  Be sure to turn on or enable access to bluetooth in settlings on your smartphone/ device  Make sure no other bluetooth devices are on and within the vicinity of your smartphone/ device and WatchPAT watch during testing.  Make sure to leave your smart phone/ device plugged in and charging all night.  When ready for bed:  Follow the instructions step by step in the WatchPAT One App to activate the testing device. For additional  instructions, including video instruction, visit the WatchPAT One video on Youtube. You can search for [place] One within Youtube (video is 4 minutes and 18 seconds) or enter: https://youtube/watch?[email or web address]_[email or web address] Please note: You will be prompted to enter a Pin to connect via bluetooth when starting the test. The PIN will be assigned to you when you receive the test.  The device is disposable, but it recommended that you retain the device until you receive a call letting you know the study has been received and the results have been interpreted.  We will let you know if the study did not transmit to [place] properly after the test is completed. You do not need to call us to confirm the receipt of the test.  Please complete the test within 48 hours of receiving PIN.   Frequently Asked Questions:  What is Watch [place] one?  A single use fully disposable home sleep apnea testing device and will not need to be returned after completion.  What are the requirements to use WatchPAT one?  The be able to have a successful watchpat one sleep study, you should have your Watch pat one device, your smart phone, watch pat one app, your PIN number and Internet access What type of phone do I need?  You should have a smart phone that uses Android 5.1 and above or any Iphone with IOS 10 and above How can I download the WatchPAT one app?  Based on your device type search for WatchPAT one app either in google play for  android devices or APP store for Iphone's Where will I get my PIN for the study?  Your PIN will be provided by your physician's office. It is used for authentication and if you lose/forget your PIN, please reach out to your providers office.  I do not have Internet at home. Can I do WatchPAT one study?  WatchPAT One needs Internet connection throughout the night to be able to transmit the sleep data. You can use your home/local internet or your cellular's data package. However, it is always recommended to use  home/local Internet. It is estimated that between 20MB-30MB will be used with each study.However, the application will be looking for 80MB space in the phone to start the study.  What happens if I lose internet or bluetooth connection?  During the internet disconnection, your phone will not be able to transmit the sleep data. All the data, will be stored in your phone. As soon as the internet connection is back on, the phone will being sending the sleep data. During the bluetooth disconnection, WatchPAT one will not be able to to send the sleep data to your phone. Data will be kept in the [facility] one until two devices have bluetooth connection back on. As soon as the connection is back on, WatchPAT one will send the sleep data to the phone.  How long do I need to wear the WatchPAT one?  After you start the study, you should wear the device at least 6 hours.  How far should I keep my phone from the device?  During the night, your phone should be within 15 feet.  What happens if I leave the room for restroom or other reasons?  Leaving the room for any reason will not cause any problem. As soon as your get back to the room, both devices will reconnect and will continue to send the sleep data. Can I use my phone during the sleep study?  Yes, you can use your phone as usual during the study. But it is recommended to put your watchpat one on when you are ready to go to bed.  How will I get my study results?  A soon as you completed your study, your sleep data will be sent to the provider. They will then share the results with you when they are ready.     Follow-Up: At [facility], you and your health needs are our priority.  As part of our continuing mission to provide you with exceptional heart care, we have created designated Provider Care Teams.  These Care Teams include your primary Cardiologist (physician) and Advanced Practice Providers (APPs -  Physician Assistants and Nurse  Practitioners) who all work together to provide you with the care you need, when you need it.  We recommend signing up for the patient portal called "MyChart".  Sign up information is provided on this After Visit Summary.  MyChart is used to connect with patients for Virtual Visits (Telemedicine).  Patients are able to view lab/test results, encounter notes, upcoming appointments, etc.  Non-urgent messages can be sent to your provider as well.   To learn more about what you can do with MyChart, go to [email or web address].    Your next appointment:    12 months with Dr. Debara Pickett

## 2023-02-05 NOTE — Progress Notes (Signed)
LIPID CLINIC CONSULT NOTE  Chief Complaint:  Follow-up dyslipidemia  Primary Care Physician: Tonia Ghent, MD  Primary Cardiologist:  None  HPI:  Cameron Larsen is a 62 y.o. male who is being seen today for the evaluation of dyslipidemia at the request of Damita Dunnings, Elveria Rising, MD. this is a pleasant 62 year old male who is kindly referred for evaluation management of dyslipidemia.  He had previously seen Dr. Marlou Porch back in 2020.  He had work-up for chest pain including coronary CT angiography which showed a calcium score of 208, 88th percentile for age and sex matched controls.  There was some mild stenosis of the proximal LAD and circumflex but they were not flow-limiting.  Unfortunately, he has been statin intolerant having taken rosuvastatin, atorvastatin, lovastatin and Livalo, all of which caused myalgias.  He has more recently been on ezetimibe.  He states that even on ezetimibe he says his cholesterol has gone up somewhat.  He has been a little less active and he reports his diet is variable but generally pretty good avoiding saturated fats, fast foods and fried foods.  His most recent lipid profile showed total cholesterol 245, triglycerides 78, HDL 55 and LDL 174.  He reports a strong family history of heart disease including his father who had bypass surgery at a younger age and his grandfather on his father side.  Lipids are concerning for probable familial hyperlipidemia when you consider his LDL off therapy to be much higher.  02/05/2023  Mr. Tsukamoto returns today for follow-up of dyslipidemia.  He has had an excellent response to Repatha.  His LDL particle number is now down to 588.  LDL-C is 48, triglycerides 70 and HDL 58.  He did have a mildly elevated LP(a) at 83.6 nmol/L.  This is likely 20 to 30% lower than it was prior to starting Repatha therapy.  It is likely minimally additive to his cardiovascular risk but is a genetic factor.  In addition, today he reports that his  wife had noted that he wakes up gasping for air at sometimes and snores loudly.  He notes some fatigue during the day and sometimes has unrestful sleep.  His stop-bang score is 6.  PMHx:  Past Medical History:  Diagnosis Date   Allergy    SEASONAL   GERD (gastroesophageal reflux disease)    in the past   Herniated cervical disc    surgery 2017   Hyperlipidemia     Past Surgical History:  Procedure Laterality Date   CERVICAL FUSION  2017   COLONOSCOPY     DENTAL SURGERY     POLYPECTOMY      FAMHx:  Family History  Problem Relation Age of Onset   Breast cancer Mother    Uterine cancer Mother    Coronary artery disease Father        CABG 2008   Hyperlipidemia Father    Bladder Cancer Father    Coronary artery disease Other    Hearing loss Other    Prostate cancer Neg Hx    Colon cancer Neg Hx    Esophageal cancer Neg Hx    Colon polyps Neg Hx    Rectal cancer Neg Hx    Stomach cancer Neg Hx    Crohn's disease Neg Hx    Ulcerative colitis Neg Hx     SOCHx:   reports that he has quit smoking. He has never been exposed to tobacco smoke. He has never used smokeless tobacco. He  reports current alcohol use. He reports that he does not use drugs.  ALLERGIES:  Allergies  Allergen Reactions   Cefuroxime Axetil     fever   Ciprofloxacin     fever   Crestor [Rosuvastatin]     Aches at '10mg'$  daily   Lipitor [Atorvastatin]     Muscle aches on 20 mg qd   Livalo [Pitavastatin] Other (See Comments)    aches    ROS: Pertinent items noted in HPI and remainder of comprehensive ROS otherwise negative.  HOME MEDS: Current Outpatient Medications on File Prior to Visit  Medication Sig Dispense Refill   aspirin EC 81 MG tablet Take 1 tablet (81 mg total) by mouth daily. 90 tablet 3   Cholecalciferol (VITAMIN D-3) 5000 units TABS Take 5,000 Int'l Units by mouth daily.     Co-Enzyme Q-10 100 MG CAPS Take 300 mg by mouth daily.     diazepam (VALIUM) 5 MG tablet Take 0.5-1  tablets (2.5-5 mg total) by mouth daily as needed for anxiety (for plane flights). 5 tablet 0   Evolocumab (REPATHA SURECLICK) [id number] MG/ML SOAJ Inject 140 mg into the skin every 14 (fourteen) days. 6 mL 3   ezetimibe (ZETIA) 10 MG tablet Take 1 tablet (10 mg total) by mouth daily. 90 tablet 3   hydrOXYzine (ATARAX) 10 MG tablet Take 0.5-1 tablets (5-10 mg total) by mouth 3 (three) times daily as needed for anxiety. 30 tablet 1   Krill Oil 300 MG CAPS Take 1 capsule by mouth daily.     Multiple Vitamin (MULTIVITAMIN ADULT PO) Take by mouth daily. CENTRUM SILVER,50 +     Probiotic Product (PROBIOTIC DAILY PO) Take by mouth.     Probiotic Product (PROBIOTIC DAILY) CAPS Take 1 capsule by mouth daily.     Current Facility-Administered Medications on File Prior to Visit  Medication Dose Route Frequency Provider Last Rate Last Admin   0.9 %  sodium chloride infusion  500 mL Intravenous Continuous Armbruster, Carlota Raspberry, MD        LABS/IMAGING: No results found for this or any previous visit (from the past 48 hour(s)). No results found.  LIPID PANEL:    Component Value Date/Time   CHOL 245 (H) 10/09/2022 1004   CHOL 182 02/15/2020 0834   CHOL 181 04/06/2015 0830   TRIG 78.0 10/09/2022 1004   TRIG 93 04/06/2015 0830   HDL 55.30 10/09/2022 1004   HDL 55 02/15/2020 0834   HDL 50 04/06/2015 0830   CHOLHDL 4 10/09/2022 1004   VLDL 15.6 10/09/2022 1004   LDLCALC 174 (H) 10/09/2022 1004   LDLCALC 112 (H) 02/15/2020 0834   LDLCALC 112 (H) 04/06/2015 0830   LDLDIRECT 176.2 08/06/2010 0917    WEIGHTS: Wt Readings from Last 3 Encounters:  10/28/22 167 lb 12.8 oz (76.1 kg)  09/25/22 166 lb 6.4 oz (75.5 kg)  09/04/22 166 lb 6.4 oz (75.5 kg)    VITALS: There were no vitals taken for this visit.  EXAM: Deferred  EKG: Deferred  ASSESSMENT: Probable familial hyperlipidemia Family history of high cholesterol and heart disease in his father and grandfather Mild nonobstructive coronary  disease with CAC score of 208, 88th percentile for age and sex matched controls (2020) Statin intolerant-myalgias At risk for obstructive sleep apnea  PLAN: 1.   Mr. Tardif has had a significant improvement in his lipids on Repatha.  His lipid profile is at target.  His LP(a) is only minimally elevated.  I would recommend we continue  this current therapy.  His wife had some concern about him possibly having a sleep disorder.  We discussed evaluation for this today and he is agreeable to be it more home sleep study test.  As mentioned his stop-bang score is 6.  Will reach out to him with those results as interpreted by our board-certified sleep physicians.  Plan otherwise follow-up with me annually or sooner as necessary.  Pixie Casino, MD, [facility], [place] Director of the Advanced Lipid Disorders &  Cardiovascular Risk Reduction Clinic Diplomate of the American Board of Clinical Lipidology Attending Cardiologist  Direct Dial: [phone]  Fax: [phone]  Website:  [email or web address]  Nadean Corwin Korban Shearer 02/05/2023, 1:24 PM

## 2023-02-09 ENCOUNTER — Telehealth: Payer: Self-pay

## 2023-02-09 ENCOUNTER — Telehealth: Payer: Self-pay | Admitting: *Deleted

## 2023-02-09 NOTE — Telephone Encounter (Signed)
Secure chat sent to Debria Garret ok to activate itamar. Per [name] I at [facility] no PA is required.

## 2023-02-09 NOTE — Telephone Encounter (Signed)
I called the patient concerning the Cameron Larsen, LVM to return my call.

## 2023-02-10 ENCOUNTER — Telehealth: Payer: Self-pay

## 2023-02-10 NOTE — Telephone Encounter (Signed)
Called and made the patient aware that HE may proceed with the [facility] Sleep Study. PIN # provided to the patient. Patient made aware that HE will be contacted after the test has been read with the results and any recommendations. Patient verbalized understanding and thanked me for the call.

## 2023-02-25 ENCOUNTER — Encounter (INDEPENDENT_AMBULATORY_CARE_PROVIDER_SITE_OTHER): Payer: No Typology Code available for payment source | Admitting: Cardiology

## 2023-02-25 DIAGNOSIS — G4733 Obstructive sleep apnea (adult) (pediatric): Secondary | ICD-10-CM

## 2023-02-26 ENCOUNTER — Ambulatory Visit: Payer: No Typology Code available for payment source | Attending: Internal Medicine

## 2023-02-26 DIAGNOSIS — R0681 Apnea, not elsewhere classified: Secondary | ICD-10-CM

## 2023-02-26 DIAGNOSIS — G4719 Other hypersomnia: Secondary | ICD-10-CM

## 2023-02-26 DIAGNOSIS — R0683 Snoring: Secondary | ICD-10-CM

## 2023-02-26 NOTE — Procedures (Signed)
SLEEP STUDY REPORT Patient Information Study Date: 02/25/2023 Patient Name: Cameron Larsen Patient ID: [id number] Birth Date: Oct 31, 1961 Age: 62 Gender: Male BMI: 26.3 (W=167 lb, H=5' 7'') Stop Bang: 6 Referring Physician: K. Mali Hilty, MD  TEST DESCRIPTION: Home sleep apnea testing was completed using the WatchPat, a Type 1 device, utilizing peripheral arterial tonometry (PAT), chest movement, actigraphy, pulse oximetry, pulse rate, body position and snore. AHI was calculated with apnea and hypopnea using valid sleep time as the denominator. RDI includes apneas, hypopneas, and RERAs. The data acquired and the scoring of sleep and all associated events were performed in accordance with the recommended standards and specifications as outlined in the AASM Manual for the Scoring of Sleep and Associated Events 2.2.0 (2015).   FINDINGS:   1. Mild Obstructive Sleep Apnea with AHI 10.7/hr and RDI 25.1/hr.   2. No Central Sleep Apnea with pAHIc 0.8/hr.   3. Oxygen desaturations as low as 87%.   4. Mild to moderate snoring was present. O2 sats were < 88% for 0.1 min.   5. Total sleep time was 8 hrs and 21 min.   6. 22.3% of total sleep time was spent in REM sleep.   7. Normal sleep onset latency at 21 min.   8. Prolonged REM sleep onset latency at 115 min.   9. Total awakenings were 7.  10. Arrhythmia detection:  None.  DIAGNOSIS: Mild Obstructive Sleep Apnea (G47.33)  RECOMMENDATIONS:   1.  Clinical correlation of these findings is necessary.  The decision to treat obstructive sleep apnea (OSA) is usually based on the presence of apnea symptoms or the presence of associated medical conditions such as Hypertension, Congestive Heart Failure, Atrial Fibrillation or Obesity.  The most common symptoms of OSA are snoring, gasping for breath while sleeping, daytime sleepiness and fatigue.   2.  Initiating apnea therapy is recommended given the presence of symptoms and/or associated  conditions. Recommend proceeding with one of the following:     a.  Auto-CPAP therapy with a pressure range of 5-20cm H2O.     b.  An oral appliance (OA) that can be obtained from certain dentists with expertise in sleep medicine.  These are primarily of use in non-obese patients with mild and moderate disease.     c.  An ENT consultation which may be useful to look for specific causes of obstruction and possible treatment options.     d.  If patient is intolerant to PAP therapy, consider referral to ENT for evaluation for hypoglossal nerve stimulator.   3.  Close follow-up is necessary to ensure success with CPAP or oral appliance therapy for maximum benefit.  4.  A follow-up oximetry study on CPAP is recommended to assess the adequacy of therapy and determine the need for supplemental oxygen or the potential need for Bi-level therapy.  An arterial blood gas to determine the adequacy of baseline ventilation and oxygenation should also be considered.  5.  Healthy sleep recommendations include:  adequate nightly sleep (normal 7-9 hrs/night), avoidance of caffeine after noon and alcohol near bedtime, and maintaining a sleep environment that is cool, dark and quiet.  6.  Weight loss for overweight patients is recommended.  Even modest amounts of weight loss can significantly improve the severity of sleep apnea.  7.  Snoring recommendations include:  weight loss where appropriate, side sleeping, and avoidance of alcohol before bed.  8.  Operation of motor vehicle should be avoided when sleepy.  Signature: Fransico Him,  MD; Leonidas Romberg; Diplomat, American Board of Sleep Medicine Electronically Signed: 02/26/2023

## 2023-03-03 ENCOUNTER — Telehealth: Payer: Self-pay | Admitting: *Deleted

## 2023-03-03 NOTE — Telephone Encounter (Signed)
Message left to return a call to discuss sleep study results and recommendations. 

## 2023-03-09 ENCOUNTER — Encounter: Payer: Self-pay | Admitting: Family

## 2023-03-09 ENCOUNTER — Ambulatory Visit: Payer: No Typology Code available for payment source | Admitting: Family

## 2023-03-09 VITALS — BP 130/76 | HR 89 | Temp 98.1°F | Ht 67.0 in | Wt 161.2 lb

## 2023-03-09 DIAGNOSIS — J0100 Acute maxillary sinusitis, unspecified: Secondary | ICD-10-CM | POA: Diagnosis not present

## 2023-03-09 DIAGNOSIS — J329 Chronic sinusitis, unspecified: Secondary | ICD-10-CM | POA: Insufficient documentation

## 2023-03-09 MED ORDER — DOXYCYCLINE HYCLATE 100 MG PO TABS: 100.0000 mg | ORAL_TABLET | Freq: Two times a day (BID) | ORAL | 0 refills | Status: AC

## 2023-03-09 NOTE — Patient Instructions (Addendum)
As discussed, I am concerned that you have developed a bacterial sinusitis.  It is appropriate to start doxycycline at this time.  Please avoid the sun as discussed  Ensure to take probiotics while on antibiotics and also for 2 weeks after completion. This can either be by eating yogurt daily or taking a probiotic supplement over the counter such as Culturelle.It is important to re-colonize the gut with good bacteria and also to prevent any diarrheal infections associated with antibiotic use.    You may continue Mucinex and Flonase as well.  Sinus Infection, Adult A sinus infection, also called sinusitis, is inflammation of your sinuses. Sinuses are hollow spaces in the bones around your face. Your sinuses are located: Around your eyes. In the middle of your forehead. Behind your nose. In your cheekbones. Mucus normally drains out of your sinuses. When your nasal tissues become inflamed or swollen, mucus can become trapped or blocked. This allows bacteria, viruses, and fungi to grow, which leads to infection. Most infections of the sinuses are caused by a virus. A sinus infection can develop quickly. It can last for up to 4 weeks (acute) or for more than 12 weeks (chronic). A sinus infection often develops after a cold. What are the causes? This condition is caused by anything that creates swelling in the sinuses or stops mucus from draining. This includes: Allergies. Asthma. Infection from bacteria or viruses. Deformities or blockages in your nose or sinuses. Abnormal growths in the nose (nasal polyps). Pollutants, such as chemicals or irritants in the air. Infection from fungi. This is rare. What increases the risk? You are more likely to develop this condition if you: Have a weak body defense system (immune system). Do a lot of swimming or diving. Overuse nasal sprays. Smoke. What are the signs or symptoms? The main symptoms of this condition are pain and a feeling of pressure  around the affected sinuses. Other symptoms include: Stuffy nose or congestion that makes it difficult to breathe through your nose. Thick yellow or greenish drainage from your nose. Tenderness, swelling, and warmth over the affected sinuses. A cough that may get worse at night. Decreased sense of smell and taste. Extra mucus that collects in the throat or the back of the nose (postnasal drip) causing a sore throat or bad breath. Tiredness (fatigue). Fever. How is this diagnosed? This condition is diagnosed based on: Your symptoms. Your medical history. A physical exam. Tests to find out if your condition is acute or chronic. This may include: Checking your nose for nasal polyps. Viewing your sinuses using a device that has a light (endoscope). Testing for allergies or bacteria. Imaging tests, such as an MRI or CT scan. In rare cases, a bone biopsy may be done to rule out more serious types of fungal sinus disease. How is this treated? Treatment for a sinus infection depends on the cause and whether your condition is chronic or acute. If caused by a virus, your symptoms should go away on their own within 10 days. You may be given medicines to relieve symptoms. They include: Medicines that shrink swollen nasal passages (decongestants). A spray that eases inflammation of the nostrils (topical intranasal corticosteroids). Rinses that help get rid of thick mucus in your nose (nasal saline washes). Medicines that treat allergies (antihistamines). Over-the-counter pain relievers. If caused by bacteria, your health care provider may recommend waiting to see if your symptoms improve. Most bacterial infections will get better without antibiotic medicine. You may be given antibiotics if  you have: A severe infection. A weak immune system. If caused by narrow nasal passages or nasal polyps, surgery may be needed. Follow these instructions at home: Medicines Take, use, or apply over-the-counter  and prescription medicines only as told by your health care provider. These may include nasal sprays. If you were prescribed an antibiotic medicine, take it as told by your health care provider. Do not stop taking the antibiotic even if you start to feel better. Hydrate and humidify  Drink enough fluid to keep your urine pale yellow. Staying hydrated will help to thin your mucus. Use a cool mist humidifier to keep the humidity level in your home above 50%. Inhale steam for 10-15 minutes, 3-4 times a day, or as told by your health care provider. You can do this in the bathroom while a hot shower is running. Limit your exposure to cool or dry air. Rest Rest as much as possible. Sleep with your head raised (elevated). Make sure you get enough sleep each night. General instructions  Apply a warm, moist washcloth to your face 3-4 times a day or as told by your health care provider. This will help with discomfort. Use nasal saline washes as often as told by your health care provider. Wash your hands often with soap and water to reduce your exposure to germs. If soap and water are not available, use hand sanitizer. Do not smoke. Avoid being around people who are smoking (secondhand smoke). Keep all follow-up visits. This is important. Contact a health care provider if: You have a fever. Your symptoms get worse. Your symptoms do not improve within 10 days. Get help right away if: You have a severe headache. You have persistent vomiting. You have severe pain or swelling around your face or eyes. You have vision problems. You develop confusion. Your neck is stiff. You have trouble breathing. These symptoms may be an emergency. Get help right away. Call 911. Do not wait to see if the symptoms will go away. Do not drive yourself to the hospital. Summary A sinus infection is soreness and inflammation of your sinuses. Sinuses are hollow spaces in the bones around your face. This condition is  caused by nasal tissues that become inflamed or swollen. The swelling traps or blocks the flow of mucus. This allows bacteria, viruses, and fungi to grow, which leads to infection. If you were prescribed an antibiotic medicine, take it as told by your health care provider. Do not stop taking the antibiotic even if you start to feel better. Keep all follow-up visits. This is important. This information is not intended to replace advice given to you by your health care provider. Make sure you discuss any questions you have with your health care provider. Document Revised: 10/29/2021 Document Reviewed: 10/29/2021 Elsevier Patient Education  [employer].

## 2023-03-09 NOTE — Assessment & Plan Note (Signed)
Nontoxic in appearance.  Duration 3 weeks.  Concern for bacterial sinusitis.  Advised patient start doxycycline with probiotics.  He may continue Flonase, Mucinex

## 2023-03-09 NOTE — Progress Notes (Signed)
Assessment & Plan:  Acute non-recurrent maxillary sinusitis Assessment & Plan: Nontoxic in appearance.  Duration 3 weeks.  Concern for bacterial sinusitis.  Advised patient start doxycycline with probiotics.  He may continue Flonase, Mucinex  Orders: -     Doxycycline Hyclate; Take 1 tablet (100 mg total) by mouth 2 (two) times daily for 7 days.  Dispense: 14 tablet; Refill: 0     Return precautions given.   Risks, benefits, and alternatives of the medications and treatment plan prescribed today were discussed, and patient expressed understanding.   Education regarding symptom management and diagnosis given to patient on AVS either electronically or printed.  No follow-ups on file.  Mable Paris, FNP  Subjective:    Patient ID: Cameron Larsen, male    DOB: 19-Feb-1961, 62 y.o.   MRN: [id number]  CC: Cameron Larsen is a 62 y.o. male who presents today for an acute visit.    HPI: Complains of thick sinus congestion x 3 weeks, unchanged.   Endorses nasal congestion, sneezing, sinus pressure  He has been using  flonase, mucinex.  H/o seasonal allergies  No cp, sore throat,ear pain No recent abx   No ckd  Allergies: Cefuroxime axetil, Ciprofloxacin, Crestor [rosuvastatin], Lipitor [atorvastatin], and Livalo [pitavastatin] Current Outpatient Medications on File Prior to Visit  Medication Sig Dispense Refill   aspirin EC 81 MG tablet Take 1 tablet (81 mg total) by mouth daily. 90 tablet 3   Cholecalciferol (VITAMIN D-3) 5000 units TABS Take 5,000 Int'l Units by mouth daily.     Co-Enzyme Q-10 100 MG CAPS Take 300 mg by mouth daily.     diazepam (VALIUM) 5 MG tablet Take 0.5-1 tablets (2.5-5 mg total) by mouth daily as needed for anxiety (for plane flights). 5 tablet 0   Evolocumab (REPATHA SURECLICK) XX123456 MG/ML SOAJ Inject 140 mg into the skin every 14 (fourteen) days. 6 mL 3   ezetimibe (ZETIA) 10 MG tablet Take 1 tablet (10 mg total) by mouth daily. 90 tablet  3   hydrOXYzine (ATARAX) 10 MG tablet Take 0.5-1 tablets (5-10 mg total) by mouth 3 (three) times daily as needed for anxiety. 30 tablet 1   Krill Oil 300 MG CAPS Take 1 capsule by mouth daily.     Multiple Vitamin (MULTIVITAMIN ADULT PO) Take by mouth daily. CENTRUM SILVER,50 +     Probiotic Product (PROBIOTIC DAILY PO) Take by mouth.     Probiotic Product (PROBIOTIC DAILY) CAPS Take 1 capsule by mouth daily.     Current Facility-Administered Medications on File Prior to Visit  Medication Dose Route Frequency Provider Last Rate Last Admin   0.9 %  sodium chloride infusion  500 mL Intravenous Continuous Armbruster, Carlota Raspberry, MD        Review of Systems  Constitutional:  Negative for chills and fever.  HENT:  Positive for congestion, sinus pressure and sneezing.   Respiratory:  Negative for cough and shortness of breath.   Cardiovascular:  Negative for chest pain and palpitations.  Gastrointestinal:  Negative for nausea and vomiting.      Objective:    BP 130/76   Pulse 89   Temp 98.1 F (36.7 C) (Oral)   Ht 5\' 7"  (1.702 m)   Wt 161 lb 3.2 oz (73.1 kg)   SpO2 96%   BMI 25.25 kg/m   BP Readings from Last 3 Encounters:  03/09/23 130/76  02/05/23 (!) 151/84  10/28/22 126/78   Wt Readings from Last  3 Encounters:  03/09/23 161 lb 3.2 oz (73.1 kg)  02/05/23 168 lb 3.2 oz (76.3 kg)  10/28/22 167 lb 12.8 oz (76.1 kg)    Physical Exam Vitals reviewed.  Constitutional:      Appearance: He is well-developed.  HENT:     Head: Normocephalic and atraumatic.     Right Ear: Hearing, tympanic membrane, ear canal and external ear normal. No decreased hearing noted. No drainage, swelling or tenderness. No middle ear effusion. Tympanic membrane is not injected, erythematous or bulging.     Left Ear: Hearing, tympanic membrane, ear canal and external ear normal. No decreased hearing noted. No drainage, swelling or tenderness.  No middle ear effusion. Tympanic membrane is not injected,  erythematous or bulging.     Nose: Nose normal.     Right Sinus: No maxillary sinus tenderness or frontal sinus tenderness.     Left Sinus: No maxillary sinus tenderness or frontal sinus tenderness.     Mouth/Throat:     Pharynx: Uvula midline. No oropharyngeal exudate or posterior oropharyngeal erythema.     Tonsils: No tonsillar abscesses.  Eyes:     Conjunctiva/sclera: Conjunctivae normal.  Cardiovascular:     Rate and Rhythm: Regular rhythm.     Heart sounds: Normal heart sounds.  Pulmonary:     Effort: Pulmonary effort is normal. No respiratory distress.     Breath sounds: Normal breath sounds. No wheezing, rhonchi or rales.  Lymphadenopathy:     Head:     Right side of head: No submental, submandibular, tonsillar, preauricular, posterior auricular or occipital adenopathy.     Left side of head: No submental, submandibular, tonsillar, preauricular, posterior auricular or occipital adenopathy.     Cervical: No cervical adenopathy.  Skin:    General: Skin is warm and dry.  Neurological:     Mental Status: He is alert.  Psychiatric:        Speech: Speech normal.        Behavior: Behavior normal.

## 2023-03-13 ENCOUNTER — Other Ambulatory Visit: Payer: Self-pay | Admitting: Cardiology

## 2023-03-13 DIAGNOSIS — G4733 Obstructive sleep apnea (adult) (pediatric): Secondary | ICD-10-CM

## 2023-03-13 NOTE — Telephone Encounter (Signed)
Patient notified per Dr Mayford Knife ok for oral appliance. Notify us once he has the appliance for follow up sleep study on appliance. He voiced understanding. Patient requests for order to be sent to Dr Donnamarie Poag with [facility] in [place].

## 2023-03-13 NOTE — Telephone Encounter (Signed)
Patient returned a call to me and given his sleep study results and recommendations. He would like to know if it would be ok to use a oral appliance. His wife works for a Education officer, community in [place] that treats OSA and as a benefit for his wife being an employee he can get a oral appliance free of charge. Message will be sent to Dr Mayford Knife for further review and recommendations

## 2023-03-13 NOTE — Telephone Encounter (Signed)
-----   Message from Quintella Reichert, MD sent at 02/26/2023 10:28 AM EDT ----- Please let patient know that they have sleep apnea and recommend treating with CPAP.  Please order an auto CPAP from 4-15cm H2O with heated humidity and mask of choice.  Order overnight pulse ox on CPAP.  Followup with me in 6 weeks.

## 2023-03-16 ENCOUNTER — Encounter: Payer: Self-pay | Admitting: Internal Medicine

## 2023-05-20 ENCOUNTER — Telehealth: Payer: Self-pay | Admitting: Cardiology

## 2023-05-20 NOTE — Telephone Encounter (Signed)
Pt is following from a previous encounter about being referred to a dentist for oral device. He states the office never received referral and he would like to come pick it up, if possible. Please advise.

## 2023-05-25 ENCOUNTER — Encounter: Payer: Self-pay | Admitting: Internal Medicine

## 2023-06-16 ENCOUNTER — Other Ambulatory Visit: Payer: Self-pay | Admitting: Family Medicine

## 2023-06-16 NOTE — Telephone Encounter (Signed)
Medication: Ezetimibe 10 mg Directions: Take 1 tablet po daily  Last given: 05/12/22 Number refills: 3 Last o/v: 05/12/22 Follow up: Not on file  Labs: 10/2022 (FLP) 01/29/23 (LP(a))

## 2023-06-17 ENCOUNTER — Encounter: Payer: Self-pay | Admitting: Family Medicine

## 2023-06-19 NOTE — Telephone Encounter (Signed)
Reached out to [facility] in [place] and they will call us back to clarify patients oral appliance referral.

## 2023-06-24 ENCOUNTER — Telehealth: Payer: Self-pay

## 2023-06-24 DIAGNOSIS — G4733 Obstructive sleep apnea (adult) (pediatric): Secondary | ICD-10-CM

## 2023-06-24 NOTE — Telephone Encounter (Signed)
Okay to release records.  Dr. Mayford Knife actually interpreted his sleep study.

## 2023-06-24 NOTE — Telephone Encounter (Signed)
Call to patient to try and facilitate referral to sleep dentist of his choice, [facility] in [place] for oral appliance. Order was originally replaced in April but was cancelled a few days later as it was entered incorrectly. New order placed.   Patient states this is where his wife works and he would get a significant discount on his oral appliance if he can have the referral sent to this location. However, this practice can only accept referrals by email. After checking with front desk staff, we do not submit patient information for referrals through email.   Patient is willing to come pick up his medical records and transport them to [facility] himself. Waiting to hear from front desk staff if this is a workable option.

## 2023-07-10 NOTE — Telephone Encounter (Signed)
email referral to [email or web address]. fax # [phone]. For [facility]

## 2023-07-30 ENCOUNTER — Encounter: Payer: Self-pay | Admitting: Internal Medicine

## 2023-07-30 NOTE — Telephone Encounter (Signed)
Routed to sleep medicine team, Veda Canning and Dr. Mayford Knife

## 2023-08-13 ENCOUNTER — Ambulatory Visit: Payer: No Typology Code available for payment source | Admitting: Internal Medicine

## 2023-08-13 ENCOUNTER — Encounter: Payer: Self-pay | Admitting: Internal Medicine

## 2023-08-13 VITALS — BP 136/84 | HR 76 | Temp 97.7°F | Ht 67.0 in | Wt 164.0 lb

## 2023-08-13 DIAGNOSIS — G4489 Other headache syndrome: Secondary | ICD-10-CM | POA: Diagnosis not present

## 2023-08-13 DIAGNOSIS — R42 Dizziness and giddiness: Secondary | ICD-10-CM | POA: Diagnosis not present

## 2023-08-13 DIAGNOSIS — R519 Headache, unspecified: Secondary | ICD-10-CM | POA: Insufficient documentation

## 2023-08-13 MED ORDER — MECLIZINE HCL 25 MG PO TABS: 25.0000 mg | ORAL_TABLET | Freq: Three times a day (TID) | ORAL | 1 refills | Status: DC | PRN

## 2023-08-13 NOTE — Assessment & Plan Note (Signed)
This doesn't go along with the vertigo---unless he has Schwannoma Will check MRI  If negative--to ENT Continue ibuprofen prn

## 2023-08-13 NOTE — Progress Notes (Signed)
Subjective:    Patient ID: Cameron Larsen, male    DOB: 1961-03-22, 62 y.o.   MRN: [id number]  HPI Here due to dizziness  Started 3 days before Never had problems like this forever Turned over in bed--and got dizzy Then got up and walked to the kitchen--and fell (no LOC) Since then, some headaches---top of head Dizziness is frequent but not constant---still associated with head movement Clear sense of movement  No fever No URI symptoms COVID test negative yesterday No head trauma  Mom and dad both with vertigo and hearing loss He has "severe" tinnitus (most of his life)  Current Outpatient Medications on File Prior to Visit  Medication Sig Dispense Refill   aspirin EC 81 MG tablet Take 1 tablet (81 mg total) by mouth daily. 90 tablet 3   Cholecalciferol (VITAMIN D-3) 5000 units TABS Take 5,000 Int'l Units by mouth daily.     Co-Enzyme Q-10 100 MG CAPS Take 300 mg by mouth daily.     diazepam (VALIUM) 5 MG tablet Take 0.5-1 tablets (2.5-5 mg total) by mouth daily as needed for anxiety (for plane flights). 5 tablet 0   Evolocumab (REPATHA SURECLICK) 140 MG/ML SOAJ Inject 140 mg into the skin every 14 (fourteen) days. 6 mL 3   ezetimibe (ZETIA) 10 MG tablet TAKE 1 TABLET BY MOUTH DAILY. 90 tablet 1   hydrOXYzine (ATARAX) 10 MG tablet Take 0.5-1 tablets (5-10 mg total) by mouth 3 (three) times daily as needed for anxiety. 30 tablet 1   Krill Oil 300 MG CAPS Take 1 capsule by mouth daily.     Multiple Vitamin (MULTIVITAMIN ADULT PO) Take by mouth daily. CENTRUM SILVER,50 +     Probiotic Product (PROBIOTIC DAILY PO) Take by mouth.     Probiotic Product (PROBIOTIC DAILY) CAPS Take 1 capsule by mouth daily.     Current Facility-Administered Medications on File Prior to Visit  Medication Dose Route Frequency Provider Last Rate Last Admin   0.9 %  sodium chloride infusion  500 mL Intravenous Continuous Armbruster, Willaim Rayas, MD        Allergies  Allergen Reactions    Cefuroxime Axetil     fever   Ciprofloxacin     fever   Crestor [Rosuvastatin]     Aches at 10mg  daily   Lipitor [Atorvastatin]     Muscle aches on 20 mg qd   Livalo [Pitavastatin] Other (See Comments)    aches    Past Medical History:  Diagnosis Date   Allergy    SEASONAL   GERD (gastroesophageal reflux disease)    in the past   Herniated cervical disc    surgery 2017   Hyperlipidemia     Past Surgical History:  Procedure Laterality Date   CERVICAL FUSION  2017   COLONOSCOPY     DENTAL SURGERY     POLYPECTOMY      Family History  Problem Relation Age of Onset   Breast cancer Mother    Uterine cancer Mother    Coronary artery disease Father        CABG 2008   Hyperlipidemia Father    Bladder Cancer Father    Coronary artery disease Other    Hearing loss Other    Prostate cancer Neg Hx    Colon cancer Neg Hx    Esophageal cancer Neg Hx    Colon polyps Neg Hx    Rectal cancer Neg Hx    Stomach cancer Neg Hx  Crohn's disease Neg Hx    Ulcerative colitis Neg Hx     Social History   Socioeconomic History   Marital status: Married    Spouse name: Not on file   Number of children: 2   Years of education: Not on file   Highest education level: Not on file  Occupational History   Occupation: Airline pilot - Garment/textile technologist: [employer]  Tobacco Use   Smoking status: Former    Passive exposure: Never   Smokeless tobacco: Never   Tobacco comments:    30 yrs ago in college  Vaping Use   Vaping status: Never Used  Substance and Sexual Activity   Alcohol use: Yes    Comment: 6 pack or less per week   Drug use: No   Sexual activity: Yes    Partners: Female  Other Topics Concern   Not on file  Social History Narrative   HSG, [employer]. Married '92. 1 - Son '95; 1 daughter '00. 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  HENT:     Mouth/Throat:     Pharynx: No oropharyngeal exudate or posterior oropharyngeal erythema.  Eyes:     Extraocular Movements: Extraocular movements intact.     Pupils: Pupils are equal, round, and reactive to light.     Comments: ?slight horizontal nystagmus with left vision gaze  Cardiovascular:     Rate and Rhythm: Normal rate and regular rhythm.     Heart sounds: No murmur heard.    No gallop.  Pulmonary:     Effort: Pulmonary effort is normal.     Breath sounds: Normal breath sounds. No wheezing or rales.  Musculoskeletal:     Cervical back: Neck supple.  Lymphadenopathy:     Cervical: No cervical adenopathy.  Neurological:     Mental Status: He is alert and oriented to person, place, and time.     Cranial Nerves: Cranial nerves 2-12 are intact.     Motor: No weakness, tremor or abnormal muscle tone.     Coordination: Romberg sign negative. Coordination normal.     Gait: Gait normal.            Assessment & Plan:

## 2023-08-13 NOTE — Assessment & Plan Note (Addendum)
With long time tinnitus and FH---I would be concerned about Meniere's Too young and no ataxia--not concerned about brains stem stroke Meclizine 25 tid prn If MRI negative, will proceed with ENT evaluation (either location)

## 2023-08-14 ENCOUNTER — Encounter: Payer: Self-pay | Admitting: *Deleted

## 2023-08-14 NOTE — Telephone Encounter (Signed)
Do not see where script has been sent in.

## 2023-08-14 NOTE — Telephone Encounter (Signed)
Patients wife called today to say they still have not gotten the documentation they need for the patient to get his Mandibular sleep appliance.  Patient needs a REFERRAL LETTER and a Rx recommending Fabienne Bruns for a MANDIBULAR SLEEP APPLIANCE. It should be signed by Armanda Magic with her Rx ID number. PLEASE EMAIL TO:  [email or web address].  fax # [phone]

## 2023-08-16 ENCOUNTER — Other Ambulatory Visit: Payer: Self-pay | Admitting: Family Medicine

## 2023-08-16 MED ORDER — DIAZEPAM 5 MG PO TABS: 2.5000 mg | ORAL_TABLET | Freq: Every day | ORAL | 0 refills | Status: DC | PRN

## 2023-08-20 NOTE — Telephone Encounter (Addendum)
Referral emailed to [email or web address] attn:[phone] Bronson Curb Patient was contacted and his wife left messages for both informing them the referral has been sent.

## 2023-08-21 ENCOUNTER — Ambulatory Visit: Payer: No Typology Code available for payment source

## 2023-08-21 ENCOUNTER — Ambulatory Visit
Admission: RE | Admit: 2023-08-21 | Discharge: 2023-08-21 | Disposition: A | Discharge status: Home / Self Care | Payer: PRIVATE HEALTH INSURANCE | Source: Ambulatory Visit | Attending: Internal Medicine | Admitting: Internal Medicine

## 2023-08-21 DIAGNOSIS — G4489 Other headache syndrome: Secondary | ICD-10-CM | POA: Diagnosis present

## 2023-08-21 DIAGNOSIS — R42 Dizziness and giddiness: Secondary | ICD-10-CM | POA: Diagnosis present

## 2023-08-23 ENCOUNTER — Other Ambulatory Visit: Payer: Self-pay | Admitting: Internal Medicine

## 2023-08-23 DIAGNOSIS — R42 Dizziness and giddiness: Secondary | ICD-10-CM

## 2023-10-13 ENCOUNTER — Ambulatory Visit (INDEPENDENT_AMBULATORY_CARE_PROVIDER_SITE_OTHER): Payer: No Typology Code available for payment source | Admitting: Audiology

## 2023-10-13 ENCOUNTER — Ambulatory Visit (INDEPENDENT_AMBULATORY_CARE_PROVIDER_SITE_OTHER): Payer: No Typology Code available for payment source | Admitting: Otolaryngology

## 2023-10-13 ENCOUNTER — Encounter (INDEPENDENT_AMBULATORY_CARE_PROVIDER_SITE_OTHER): Payer: Self-pay

## 2023-10-13 VITALS — Ht 67.0 in | Wt 164.0 lb

## 2023-10-13 DIAGNOSIS — H9042 Sensorineural hearing loss, unilateral, left ear, with unrestricted hearing on the contralateral side: Secondary | ICD-10-CM | POA: Diagnosis not present

## 2023-10-13 DIAGNOSIS — R42 Dizziness and giddiness: Secondary | ICD-10-CM | POA: Diagnosis not present

## 2023-10-13 DIAGNOSIS — H8110 Benign paroxysmal vertigo, unspecified ear: Secondary | ICD-10-CM

## 2023-10-13 NOTE — Progress Notes (Signed)
Dear Dr. Alphonsus Sias, Here is my assessment for our mutual patient, Cameron Larsen. Thank you for allowing me the opportunity to care for your patient. Please do not hesitate to contact me should you have any other questions. Sincerely, Dr. Jovita Kussmaul  Otolaryngology Clinic Note Referring provider: Dr. Alphonsus Sias HPI:  Cameron Larsen is a 62 y.o. male kindly referred by Dr. Alphonsus Sias for evaluation of vertigo.  Patient reports that on Labor Day, he was in bed, and he rolled over and had a episode of dizziness (room  was spinning for maybe a few seconds). Then got up, went to kitchen, and then had a headache and same dizziness episode. This has resolved. Went to PCP around then, who ordered an MRI. Prescribed Meclizine but only took it one time. He will have intermittent episodes now, most frequently when turning over in bed. Only lasts a few seconds. Having it maybe once a week, but may has not had it for a few weeks now  No prior URI.  Family history of hearing loss: later in life. Patient, mom, and his grandmother. Mom had stapedectomy  Ear wise: Never had a hearing test, but reports bilateral tinnitus for past 30 years. High pitched, buzzing. Nothing makes it better or worse. Not interfering with sleep. Worse when quiet.  Patient denies: ear pain, fullness, drainage. 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 Occupation: Sales  Independent Review of Additional Tests or Records:  PCP notes reviewed MRI (08/2023): no obvious significant large retrocochlear masses        10/13/2023 Audiogram was independently  reviewed and interpreted by me and it reveals B/l A/A tymps Right ear- Normal hearing from [phone] Hz,with a slight notch at 4000Hz . Left ear-  Normal hearing from [phone] Hz, then a mild presumably sensorineural hearing loss at 8000 Hz.  WRT 100% at 60dB  SNHL= Sensorineural hearing loss  PMH/Meds/All/SocHx/FamHx/ROS:   Past Medical History:  Diagnosis Date   Allergy    SEASONAL   GERD (gastroesophageal reflux disease)    in the past   Herniated cervical disc    surgery 2017   Hyperlipidemia      Past Surgical History:  Procedure Laterality Date   CERVICAL FUSION  2017   COLONOSCOPY     DENTAL SURGERY     POLYPECTOMY      Family History  Problem Relation Age of Onset   Breast cancer Mother    Uterine cancer Mother    Coronary artery disease Father        CABG 2008   Hyperlipidemia Father    Bladder Cancer Father    Coronary artery disease Other    Hearing loss Other    Prostate cancer Neg Hx    Colon cancer Neg Hx    Esophageal cancer Neg Hx    Colon polyps Neg Hx    Rectal cancer Neg Hx    Stomach cancer Neg Hx    Crohn's disease Neg Hx    Ulcerative colitis Neg Hx      Social Connections: Not on file      Current Outpatient Medications:    aspirin EC 81 MG tablet, Take 1  tablet (81 mg total) by mouth daily., Disp: 90 tablet, Rfl: 3   Cholecalciferol (VITAMIN D-3) 5000 units TABS, Take 5,000 Int'l Units by mouth daily., Disp: , Rfl:    Co-Enzyme Q-10 100 MG CAPS, Take 300 mg by mouth daily., Disp: , Rfl:    diazepam (VALIUM) 5 MG tablet, Take 0.5-1 tablets (2.5-5 mg total) by mouth daily as needed for anxiety., Disp: 5 tablet, Rfl: 0   Evolocumab (REPATHA SURECLICK) 140 MG/ML SOAJ, Inject 140 mg into the skin every 14 (fourteen) days., Disp: 6 mL, Rfl: 3   ezetimibe (ZETIA) 10 MG tablet, TAKE 1 TABLET BY MOUTH DAILY., Disp: 90 tablet, Rfl: 1   Krill Oil 300 MG CAPS, Take 1 capsule by mouth daily., Disp: , Rfl:    Multiple Vitamin (MULTIVITAMIN ADULT  PO), Take by mouth daily. CENTRUM SILVER,50 +, Disp: , Rfl:    Probiotic Product (PROBIOTIC DAILY PO), Take by mouth., Disp: , Rfl:    Probiotic Product (PROBIOTIC DAILY) CAPS, Take 1 capsule by mouth daily., Disp: , Rfl:    hydrOXYzine (ATARAX) 10 MG tablet, Take 0.5-1 tablets (5-10 mg total) by mouth 3 (three) times daily as needed for anxiety. (Patient not taking: Reported on 10/13/2023), Disp: 30 tablet, Rfl: 1   meclizine (ANTIVERT) 25 MG tablet, Take 1 tablet (25 mg total) by mouth 3 (three) times daily as needed for dizziness. (Patient not taking: Reported on 10/13/2023), Disp: 90 tablet, Rfl: 1  Current Facility-Administered Medications:    0.9 %  sodium chloride infusion, 500 mL, Intravenous, Continuous, Armbruster, Willaim Rayas, MD   Physical Exam:   Ht 5\' 7"  (1.702 m)   Wt 164 lb (74.4 kg)   BMI 25.69 kg/m   Salient findings:  CN II-XII intact  Bilateral EAC clear and TM intact with well pneumatized middle ear spaces Weber 512: midline Rinne 512: AC > BC b/l  Rine 1024: AC > BC b/l  No gross nystagmus Headshake negative Dix-hallpike negative today Anterior rhinoscopy: Septum relatively midline; no masses noted No lesions of oral cavity/oropharynx No obviously palpable neck masses/lymphadenopathy/thyromegaly No respiratory distress or stridor  Seprately Identifiable Procedures:  None  Impression & Plans:  Cameron Larsen is a 62 y.o. 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 Please do not hesitate to contact me should you have any other questions.  Sincerely, Jovita Kussmaul, MD Otolarynoglogist (ENT), [facility]  Specialist Phone: [phone] Fax: [phone]  10/13/2023, 10:23 AM   MDM:  Level 3 Complexity/Problems addressed: low Data complexity: mod - independent review of MRI, audio - Morbidity: low

## 2023-10-13 NOTE — Progress Notes (Signed)
  [address], [address] [place], [place] [address] [phone]  Audiological Evaluation    Name: Cameron Larsen     DOB:   [birth year]      MRN:   [id number]                                                                                     Service Date: 10/13/2023     Accompanied by: none   Patient comes today after Dr. Allena Katz, ENT sent a referral for a hearing evaluation due to concerns with tinnitus.   Symptoms Yes Details  Hearing loss  [x]  Noticed some decline/difference  in the left ear.  Tinnitus  [x]  Both ears  Ear pain/ Ear infections  []    Balance problems  [x]  Labor Day -rolled in bed and felt spinning and then when he walked to another room he fell. He reports that at the time he had a headache and even that the vertigo went away her had the headache for a week. It seems that is getting better and now may apparently randomly get some "dizziness" which may not motion induced. It seems that with time it has improved.  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 Now wears hearing protection.  Previous ear surgeries  []    Family history  []    Amplification  []    Other  []  Reports that migraines run in his family.    Otoscopy: Right ear: Clear external ear canals and notable landmarks visualized on the tympanic membrane. Left ear:  Clear external ear canals and notable landmarks visualized on the tympanic membrane.  Tympanometry: Right ear: Type A- Normal external ear canal volume with normal middle ear pressure and tympanic membrane compliance Left ear: Type A- Normal external ear canal volume with normal middle ear pressure and tympanic membrane compliance  Pure tone Audiometry: Right ear- Normal hearing from [phone] Hz,with a slight notch at 4000Hz . Left ear-  Normal hearing from [phone] Hz, then a mild presumably sensorineural hearing loss at 8000 Hz.   The hearing test results were completed under headphones and results are deemed to be  of good reliability. Test technique:  conventional     Speech Audiometry: Right ear- Speech Reception Threshold (SRT) was obtained at 15 dBHL Left ear-Speech Reception Threshold (SRT) was obtained at 15 dBHL   Word Recognition Score Tested using NU-6 (MLV) Right ear: 100% was obtained at a presentation level of 60 dBHL with contralateral masking which is deemed as  excellent Left ear: 100% was obtained at a presentation level of 60 dBHL with contralateral masking which is deemed as  excellent    Impression: There is not a significant overall difference in pure-tone thresholds between ears. There is not a significant difference in the word recognition score in between ears.    Recommendations: Follow up with ENT as scheduled for today. Return for a hearing evaluation if concerns with hearing changes arise or per MD recommendation. Return to clinic if dizziness symptoms return. Use hearing protection when exposed to loud/damaging sounds.    Joyanna Kleman MARIE LEROUX-MARTINEZ, AUD

## 2023-11-19 ENCOUNTER — Other Ambulatory Visit: Payer: Self-pay | Admitting: *Deleted

## 2023-11-19 DIAGNOSIS — E785 Hyperlipidemia, unspecified: Secondary | ICD-10-CM

## 2024-01-26 ENCOUNTER — Other Ambulatory Visit: Payer: Self-pay | Admitting: Family Medicine

## 2024-02-13 ENCOUNTER — Other Ambulatory Visit: Payer: Self-pay | Admitting: Internal Medicine

## 2024-02-17 ENCOUNTER — Telehealth: Payer: PRIVATE HEALTH INSURANCE | Admitting: Family Medicine

## 2024-02-17 ENCOUNTER — Encounter: Payer: Self-pay | Admitting: Family Medicine

## 2024-02-17 ENCOUNTER — Ambulatory Visit: Payer: Self-pay | Admitting: Family Medicine

## 2024-02-17 DIAGNOSIS — R062 Wheezing: Secondary | ICD-10-CM | POA: Diagnosis not present

## 2024-02-17 DIAGNOSIS — J069 Acute upper respiratory infection, unspecified: Secondary | ICD-10-CM

## 2024-02-17 DIAGNOSIS — R509 Fever, unspecified: Secondary | ICD-10-CM | POA: Diagnosis not present

## 2024-02-17 MED ORDER — PREDNISONE 20 MG PO TABS
40.0000 mg | ORAL_TABLET | Freq: Every day | ORAL | 0 refills | Status: AC
Start: 2024-02-17 — End: 2024-02-22

## 2024-02-17 MED ORDER — PROMETHAZINE-DM 6.25-15 MG/5ML PO SYRP
5.0000 mL | ORAL_SOLUTION | Freq: Four times a day (QID) | ORAL | 0 refills | Status: DC | PRN
Start: 2024-02-17 — End: 2024-03-30

## 2024-02-17 MED ORDER — AZITHROMYCIN 250 MG PO TABS
ORAL_TABLET | ORAL | 0 refills | Status: DC
Start: 2024-02-17 — End: 2024-03-30

## 2024-02-17 NOTE — Telephone Encounter (Signed)
 Noted. Thanks.  I am not in clinic today.

## 2024-02-17 NOTE — Telephone Encounter (Signed)
  Chief Complaint: cough Symptoms: headache, fever, chills, productive cough, sore throat, body aches, runny nose/nasal congestion, chest and rib pain when coughing, mild SOB Frequency: sudden onset Saturday evening Pertinent Negatives: Patient denies N/A. Disposition: [] ED /[] Urgent Care (no appt availability in office) / [x] Appointment(In office/virtual)/ []  [facility]/ [] Home Care/ [] Refused Recommended Disposition /[] [facility]/ []  Follow-up with PCP Additional Notes: Patient reports symptoms started suddenly Saturday evening with headache and fever. Patient states he has been treating at home with Tylenol and Muccinex. He reports fevers have been since Saturday. He states he is concerned because his wife told him he could have RSV. Patient denies taking any home flu or COVID tests. Offered patient in person appointments but states he prefers virtual appointment today. Patient scheduled and verbalizes understanding to call back for new or worsening symptoms.  Copied from CRM [id number]. Topic: Clinical - Red Word Triage >> Feb 17, 2024  8:45 AM Theodis Sato wrote: Red Word that prompted transfer to Nurse Triage: Chest/rib pain and tightness from "violent coughing" fever of 102 since Saturday, full body chills. Reason for Disposition  Fever present > 3 days (72 hours)  Answer Assessment - Initial Assessment Questions 1. WORST SYMPTOM: "What is your worst symptom?" (e.g., cough, runny nose, muscle aches, headache, sore throat, fever)      Productive cough dark colored sputum.  2. ONSET: "When did your flu symptoms start?"      Saturday evening.  3. COUGH: "How bad is the cough?"       Severe, he states its to the point it makes it hard to catch his breath during an episode. States then he starts wheezing.  4. RESPIRATORY DISTRESS: "Describe your breathing."      States he is not concerned about his breathing at rest or when not coughing. He states "a little bit of SOB  cause I'm trying to avoid coughing"  5. FEVER: "Do you have a fever?" If Yes, ask: "What is your temperature, how was it measured, and when did it start?"     Yes, highest was 102.6. This morning 101.1.  6. EXPOSURE: "Were you exposed to someone with influenza?"       Denies.  7. FLU VACCINE: "Did you get a flu shot this year?"     Denies.  8. HIGH RISK DISEASE: "Do you have any chronic medical problems?" (e.g., heart or lung disease, asthma, weak immune system, or other HIGH RISK conditions)     He states he has high risk for heart disease due to family history.  9. OTHER SYMPTOMS: "Do you have any other symptoms?"  (e.g., runny nose, muscle aches, headache, sore throat)       Sore throat on Monday and Tuesday, body aches, nasal congestion/runny nose  Protocols used: Influenza (Flu) - [facility]

## 2024-02-17 NOTE — Progress Notes (Signed)
 Virtual Visit via Video   I connected with patient on 02/17/24 at 0915 by a video enabled telemedicine application and verified that I am speaking with the correct person using two identifiers.  Location patient: Home Location provider: [facility] Persons participating in the virtual visit: Patient and Provider  I discussed the limitations of evaluation and management by telemedicine and the availability of in person appointments. The patient expressed understanding and agreed to proceed.  Subjective:   HPI:  Pt presents today for  Chief Complaint  Patient presents with   Fever   Cough   Fever  This is a new problem. The current episode started in the past 7 days. The problem occurs constantly. The problem has been gradually worsening. The maximum temperature noted was 102 to 102.9 F. The temperature was taken using an oral thermometer. Associated symptoms include congestion, coughing, muscle aches and wheezing. Pertinent negatives include no abdominal pain, chest pain, diarrhea, ear pain, headaches, nausea, rash, sleepiness, sore throat, urinary pain or vomiting. He has tried acetaminophen (Mucinex) for the symptoms. The treatment provided no relief.  Risk factors: sick contacts   Cough This is a new problem. The current episode started in the past 7 days. The problem has been gradually worsening. The problem occurs constantly. The cough is Productive of sputum. Associated symptoms include chills, a fever, myalgias, nasal congestion, postnasal drip and wheezing. Pertinent negatives include no chest pain, ear congestion, ear pain, headaches, heartburn, hemoptysis, rash, rhinorrhea, sore throat, shortness of breath, sweats or weight loss. Nothing aggravates the symptoms. He has tried OTC cough suppressant for the symptoms. The treatment provided no relief.     Review of Systems  Constitutional:  Positive for chills, fever and malaise/fatigue. Negative  for diaphoresis and weight loss.  HENT:  Positive for congestion and postnasal drip. Negative for ear discharge, ear pain, nosebleeds, rhinorrhea, sinus pain, sore throat and tinnitus.   Respiratory:  Positive for cough, sputum production and wheezing. Negative for hemoptysis, shortness of breath and stridor.   Cardiovascular:  Negative for chest pain, palpitations, orthopnea, claudication, leg swelling and PND.  Gastrointestinal:  Negative for abdominal pain, blood in stool, constipation, diarrhea, heartburn, melena, nausea and vomiting.  Genitourinary:  Negative for dysuria.  Musculoskeletal:  Positive for myalgias. Negative for back pain, falls, joint pain and neck pain.  Skin:  Negative for itching and rash.  Neurological:  Negative for dizziness, tingling, tremors, sensory change, speech change, focal weakness, seizures, loss of consciousness, weakness and headaches.  All other systems reviewed and are negative.    Patient Active Problem List   Diagnosis Date Noted   Vertigo 08/13/2023   Headache 08/13/2023   Sinusitis 03/09/2023   Neck pain 04/24/2021   Healthcare maintenance 07/29/2020   Other fatigue 08/25/2018   Atypical chest pain 08/04/2018   Vitamin D deficiency 07/23/2017   Routine general medical examination at a health care facility 07/12/2017   Advance care planning 07/12/2017   Disequilibrium 07/12/2017   Arm paresthesia, left 07/06/2016   GERD (gastroesophageal reflux disease) 04/25/2014   Anxiety 08/10/2011   DECREASED LIBIDO 08/13/2010   Hyperlipidemia 08/27/2007    Social History   Tobacco Use   Smoking status: Former    Passive exposure: Never   Smokeless tobacco: Never   Tobacco comments:    30 yrs ago in college  Substance Use Topics   Alcohol use: Yes    Comment: 6 pack or less per week    Current Outpatient  Medications:    azithromycin (ZITHROMAX Z-PAK) 250 MG tablet, As directed, Disp: 6 tablet, Rfl: 0   predniSONE (DELTASONE) 20 MG tablet,  Take 2 tablets (40 mg total) by mouth daily with breakfast for 5 days., Disp: 10 tablet, Rfl: 0   promethazine-dextromethorphan (PROMETHAZINE-DM) 6.25-15 MG/5ML syrup, Take 5 mLs by mouth 4 (four) times daily as needed for cough., Disp: 118 mL, Rfl: 0   aspirin EC 81 MG tablet, Take 1 tablet (81 mg total) by mouth daily., Disp: 90 tablet, Rfl: 3   Cholecalciferol (VITAMIN D-3) 5000 units TABS, Take 5,000 Int'l Units by mouth daily., Disp: , Rfl:    Co-Enzyme Q-10 100 MG CAPS, Take 300 mg by mouth daily., Disp: , Rfl:    diazepam (VALIUM) 5 MG tablet, Take 0.5-1 tablets (2.5-5 mg total) by mouth daily as needed for anxiety., Disp: 5 tablet, Rfl: 0   Evolocumab (REPATHA SURECLICK) 140 MG/ML SOAJ, INJECT 140 MG INTO THE SKIN EVERY 14 (FOURTEEN) DAYS., Disp: 2 mL, Rfl: 2   ezetimibe (ZETIA) 10 MG tablet, TAKE 1 TABLET BY MOUTH DAILY. PLEASE CONTACT [facility] FOR AN APPOINTMENT., Disp: 90 tablet, Rfl: 1   hydrOXYzine (ATARAX) 10 MG tablet, Take 0.5-1 tablets (5-10 mg total) by mouth 3 (three) times daily as needed for anxiety. (Patient not taking: Reported on 10/13/2023), Disp: 30 tablet, Rfl: 1   Krill Oil 300 MG CAPS, Take 1 capsule by mouth daily., Disp: , Rfl:    meclizine (ANTIVERT) 25 MG tablet, Take 1 tablet (25 mg total) by mouth 3 (three) times daily as needed for dizziness. (Patient not taking: Reported on 10/13/2023), Disp: 90 tablet, Rfl: 1   Multiple Vitamin (MULTIVITAMIN ADULT PO), Take by mouth daily. CENTRUM SILVER,50 +, Disp: , Rfl:    Probiotic Product (PROBIOTIC DAILY PO), Take by mouth., Disp: , Rfl:    Probiotic Product (PROBIOTIC DAILY) CAPS, Take 1 capsule by mouth daily., Disp: , Rfl:   Current Facility-Administered Medications:    0.9 %  sodium chloride infusion, 500 mL, Intravenous, Continuous, Armbruster, Willaim Rayas, MD  Allergies  Allergen Reactions   Cefuroxime Axetil     fever   Ciprofloxacin     fever   Crestor [Rosuvastatin]     Aches at 10mg  daily    Lipitor [Atorvastatin]     Muscle aches on 20 mg qd   Livalo [Pitavastatin] Other (See Comments)    aches    Objective:   There were no vitals taken for this visit.  Patient is well-developed, well-nourished in no acute distress.  Resting comfortably at home.  Head is normocephalic, atraumatic.  No labored breathing.  Speech is clear and coherent with logical content.  Patient is alert and oriented at baseline.    Assessment and Plan:   Rashied "[place]" was seen today for fever and cough.  Diagnoses and all orders for this visit:  URI with cough and congestion Fever in adult Wheezing Has tried and failed symptomatic care at home. Will add promethazine-DM and prednisone for three days, pt aware if symptoms do not improve to start antibiotics as prescribed. Report new, worsening, or persistent symptoms.  -     predniSONE (DELTASONE) 20 MG tablet; Take 2 tablets (40 mg total) by mouth daily with breakfast for 5 days. -     promethazine-dextromethorphan (PROMETHAZINE-DM) 6.25-15 MG/5ML syrup; Take 5 mLs by mouth 4 (four) times daily as needed for cough. -     azithromycin (ZITHROMAX Z-PAK) 250 MG tablet; As directed  Return if symptoms worsen or fail to improve.  Kari Baars, FNP-C [facility] [address] [place], [place] [address] [phone]  02/17/2024  Time spent with the patient: 14 minutes, of which >50% was spent in obtaining information about symptoms, reviewing previous labs, evaluations, and treatments, counseling about condition (please see the discussed topics above), and developing a plan to further investigate it; had a number of questions which I addressed.

## 2024-03-23 LAB — NMR, LIPOPROFILE
Cholesterol, Total: 119 mg/dL (ref 100–199)
HDL Particle Number: 46.1 umol/L (ref >=30.5)
HDL-C: 62 mg/dL (ref >39)
LDL Particle Number: 485 nmol/L (ref <1000)
LDL Size: 19.7 nm — ABNORMAL LOW (ref >20.5)
LDL-C (NIH Calc): 42 mg/dL (ref 0–99)
LP-IR Score: 45 (ref <=45)
Small LDL Particle Number: 303 nmol/L (ref <=527)
Triglycerides: 73 mg/dL (ref 0–149)

## 2024-03-24 ENCOUNTER — Encounter: Payer: Self-pay | Admitting: Cardiovascular Disease

## 2024-03-28 NOTE — Progress Notes (Unsigned)
 Cardiology Office Note:  .   Date:  03/30/2024  ID:  Cameron Larsen, DOB 15-Feb-1961, MRN [id number] PCP: Donnie Galea, MD  [facility] Providers Cardiologist:  None    Patient Profile: .      PMH Dyslipidemia Coronary artery disease Coronary CTA 10/2018 CAC Score 208 (88th percentile) Mild stenosis proximal LAD (< 50%) not flow limiting by CTFFR MIld stenosis proximal LCx (< 50%) not flow limiting by CTFFR GERD Family history heart disease Father had CABG in his 70s Statin intolerance Myalgias on Lipitor, Livalo , Lovastatin  OSA  Referred to Advanced Lipid Disorder clinic and seen by Dr. Maximo Spar 10/2022. Previously saw Dr. Renna Cary in 2020 with work up for chest pain including coronary CTA that showed Ca score of 208 (88th percentile), mild stenosis of prox LAD and circumflex, not flow limiting. Myalgias with rosuvastatin , atorvastatin , lovastatin and LIvalo . Was more recently on ezetimibe  and despite compliance, he had an increase in cholesterol. Diet variable but tries to avoid saturated fat, fast food, and fried foods. Lipid panel at  that time with total cholesterol 245, trigs 78, HDL 55, and LDL 174. Strong family history of heart disease in his father who had bypass surgery at a young age and heart disease in paternal grandfather. LDL much higher off lipid lowering therapy, suggestive of familial hyperlipidemia. He was advised to start Repatha  and had excellent response with LDL particle number down to 588, LDL-C 48, triglycerides 70, and HDL 58. Mildly elevated LP(a) 83.6 nmol/L, likely down 20-30% on PcSK9i. His wife reported he wakes up gasping and snores loudly. Stop bang score of 6.   He was later diagnosed with sleep apnea and elected to try an oral appliance as his wife works for a Education officer, community.   Most recent lipid profile 03/22/2024 with LDL particle number 485, LDL-C 42, HDL-C 62, triglycerides 73, total cholesterol 119, and small LDL-P 303.        History of  Present Illness: .    History of Present Illness Cameron Larsen is a very pleasant 63 y.o. male who is here today for follow-up of dyslipidemia.  He reports he is feeling well. He is currently on Repatha , which has effectively lowered his cholesterol levels. He is pleased with the improvement but asks if cholesterol levels are too low. He skipped a dose of Repatha  when he had the flu which was prior to lab work completed 03/22/2024.  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 He notes occasional joint aches and is unsure if it is related to Repatha . He notes occasional chest pain, but it is not exacerbated by exercise and he has no associated shortness of breath or other concerning symptoms. He has a sleep appliance for sleep apnea, which is working well.  He denies palpitations, orthopnea, PND, presyncope or syncope.   Discussed the use of AI scribe software for clinical note transcription with the patient, who gave verbal consent to proceed.   ROS: See HPI       Studies Reviewed: Aaron Aas   EKG Interpretation Date/Time:  Wednesday March 30 2024 10:37:34 EDT Ventricular Rate:  73 PR Interval:  158 QRS Duration:  84 QT Interval:  368 QTC Calculation: 405 R Axis:   60  Text Interpretation: Normal sinus rhythm Normal ECG When compared with ECG of 17-Apr-2014 10:07, No significant change was found Confirmed by Slater Duncan [phone]) on 03/30/2024 10:45:15 AM     Lipoprotein (  a)  Date/Time Value Ref Range Status  01/29/2023 08:37 AM 83.6 (H) <75.0 nmol/L Final    Comment:    Note:  Values greater than or equal to 75.0 nmol/L may        indicate an independent risk factor for CHD,        but must be evaluated with caution when applied        to non-Caucasian populations due to the        influence of genetic factors on Lp(a) across        ethnicities.      Risk Assessment/Calculations:         STOP-Bang Score:         Physical Exam:   VS:   BP 136/84   Pulse 73   Ht 5\' 6"  (1.676 m)   Wt 160 lb 9.6 oz (72.8 kg)   SpO2 97%   BMI 25.92 kg/m    Wt Readings from Last 3 Encounters:  03/30/24 160 lb 9.6 oz (72.8 kg)  10/13/23 164 lb (74.4 kg)  08/13/23 164 lb (74.4 kg)    GEN: Well nourished, well developed in no acute distress NECK: No JVD; No carotid bruits CARDIAC: RRR, no murmurs, rubs, gallops RESPIRATORY:  Clear to auscultation without rales, wheezing or rhonchi  ABDOMEN: Soft, non-tender, non-distended EXTREMITIES:  No edema; No deformity     ASSESSMENT AND PLAN: .    Assessment & Plan CAD Coronary CTA 10/19/2018 with nonobstructive CAD by FFR. He has occasional chest pains that do not worsen with exertion and are not associated with SOB, diaphoresis, n/v.  No indication for further ischemia evaluation at this time.  EKG reveals NSR with no concerning ST abnormality.  No bleeding concerns.  Continue aspirin , Repatha , ezetimibe .  Hyperlipidemia   Lipid panel completed 03/22/2024 with LDL particle #485, LDL-C 42, HDL-C 62, triglycerides 73, total cholesterol 119, and small LDL-P 303. Joint and back aches are present but less severe than with statins. Consider Coenzyme Q10, magnesium or tumeric to alleviate muscle aches. Advised no concern with low lipid levels. Continue Repatha  every 14 days and Zetia  10 mg daily. Maintain annual cholesterol monitoring.  Hypertension   BP is elevated and remains elevated on my recheck. Increased coffee consumption today. Not currently on antihypertensive therapy. Provided guidelines for home monitoring and advised him to report to us  or PCP if BP remains above goal of < 130/80.   Cardiac Risk Assessment He reports occasional chest pains but they are not associated with exertion and are not accompanied by concerning symptoms like shortness of breath, diaphoresis, n/v.  He remains active and is able to complete his workouts without any concerning symptoms.  EKG today reveals normal sinus  rhythm.  BP mildly elevated.  I have encouraged him to monitor at home for the next 2 weeks and report to us  or PCP if BP remains elevated > 130/80.   Joint and back pain   Joint and back aches may be related to lipid lowering therapy, physical activity, or age, but are less severe than with statins. No indication to stop ezetimibe  or Repatha  at this time. Coenzyme Q10 may help. Consider using turmeric supplements or magnesium rub for additional relief.        Disposition:1 year with Dr. Maximo Spar or me  Signed, Slater Duncan, NP-C

## 2024-03-30 ENCOUNTER — Encounter (HOSPITAL_BASED_OUTPATIENT_CLINIC_OR_DEPARTMENT_OTHER): Payer: Self-pay | Admitting: Nurse Practitioner

## 2024-03-30 ENCOUNTER — Ambulatory Visit (HOSPITAL_BASED_OUTPATIENT_CLINIC_OR_DEPARTMENT_OTHER): Payer: No Typology Code available for payment source | Admitting: Nurse Practitioner

## 2024-03-30 VITALS — BP 136/84 | HR 73 | Ht 66.0 in | Wt 160.6 lb

## 2024-03-30 DIAGNOSIS — Z7189 Other specified counseling: Secondary | ICD-10-CM

## 2024-03-30 DIAGNOSIS — M2550 Pain in unspecified joint: Secondary | ICD-10-CM

## 2024-03-30 DIAGNOSIS — I10 Essential (primary) hypertension: Secondary | ICD-10-CM

## 2024-03-30 DIAGNOSIS — I2510 Atherosclerotic heart disease of native coronary artery without angina pectoris: Secondary | ICD-10-CM

## 2024-03-30 DIAGNOSIS — E785 Hyperlipidemia, unspecified: Secondary | ICD-10-CM | POA: Diagnosis not present

## 2024-03-30 MED ORDER — SCOPOLAMINE 1 MG/3DAYS TD PT72: 1.0000 | MEDICATED_PATCH | TRANSDERMAL | 1 refills | Status: AC

## 2024-03-30 NOTE — Patient Instructions (Signed)
 Medication Instructions:   1 patch (1.5 mg total) onto the skin every 3 (three) days. - Transdermal   *If you need a refill on your cardiac medications before your next appointment, please call your pharmacy*  Lab Work:  You will have fasting labs prior to your yearly follow up. I will send you a message to remind you through mychart.   If you have labs (blood work) drawn today and your tests are completely normal, you will receive your results only by: MyChart Message (if you have MyChart) OR A paper copy in the mail If you have any lab test that is abnormal or we need to change your treatment, we will call you to review the results.  Testing/Procedures:  None ordered.   Follow-Up: At [facility], you and your health needs are our priority.  As part of our continuing mission to provide you with exceptional heart care, our providers are all part of one team.  This team includes your primary Cardiologist (physician) and Advanced Practice Providers or APPs (Physician Assistants and Nurse Practitioners) who all work together to provide you with the care you need, when you need it.  Your next appointment:   1 year(s)  Provider:   K. Italy Hilty, MD or Slater Duncan, NP    We recommend signing up for the patient portal called "MyChart".  Sign up information is provided on this After Visit Summary.  MyChart is used to connect with patients for Virtual Visits (Telemedicine).  Patients are able to view lab/test results, encounter notes, upcoming appointments, etc.  Non-urgent messages can be sent to your provider as well.   To learn more about what you can do with MyChart, go to [email or web address].   Other Instructions  Your physician wants you to follow-up in: 1 year.  You will receive a reminder letter in the mail two months in advance. If you don't receive a letter, please call our office to schedule the follow-up appointment.   HOW TO TAKE YOUR BLOOD PRESSURE  Rest  5 minutes before taking your blood pressure. Don't  smoke or drink caffeinated beverages for at least 30 minutes before. Take your blood pressure before (not after) you eat. Sit comfortably with your back supported and both feet on the floor ( don't cross your legs). Elevate your arm to heart level on a table or a desk. Use the proper sized cuff.  It should fit smoothly and snugly around your bare upper arm.  There should be  Enough room to slip a fingertip under the cuff.  The bottom edge of the cuff should be 1 inch above the crease Of the elbow. Please monitor your blood pressure once daily 2 hours after your am medication. Your goal is 120/80 ----Avoid cold medicines with D or DM at the end of them----

## 2024-04-01 NOTE — Telephone Encounter (Signed)
 Referral information has been discarded since patient hasn't picked it from our office.

## 2024-05-14 ENCOUNTER — Other Ambulatory Visit: Payer: Self-pay | Admitting: Internal Medicine

## 2024-08-23 ENCOUNTER — Telehealth: Payer: Self-pay

## 2024-08-23 ENCOUNTER — Other Ambulatory Visit (HOSPITAL_COMMUNITY): Payer: Self-pay

## 2024-08-23 ENCOUNTER — Other Ambulatory Visit: Payer: Self-pay | Admitting: Family Medicine

## 2024-08-23 NOTE — Telephone Encounter (Signed)
 Pharmacy Patient Advocate Encounter  Insurance verification completed.   The patient is insured through [facility]   Ran test claim for Ezetimibe  10MG  tablets PLAN LIMITED EXCEEDED NEXT FILL IS 09/15/2024 No PA needed at this time.  This test claim was processed through [facility]- copay amounts may vary at other pharmacies due to pharmacy/plan contracts, or as the patient moves through the different stages of their insurance plan.

## 2024-08-24 ENCOUNTER — Other Ambulatory Visit (HOSPITAL_COMMUNITY): Payer: Self-pay

## 2024-09-13 ENCOUNTER — Ambulatory Visit (INDEPENDENT_AMBULATORY_CARE_PROVIDER_SITE_OTHER): Admitting: Family Medicine

## 2024-09-13 ENCOUNTER — Encounter: Payer: Self-pay | Admitting: Family Medicine

## 2024-09-13 VITALS — BP 142/82 | HR 71 | Temp 98.5°F | Ht 65.32 in | Wt 163.2 lb

## 2024-09-13 DIAGNOSIS — F419 Anxiety disorder, unspecified: Secondary | ICD-10-CM

## 2024-09-13 DIAGNOSIS — I2510 Atherosclerotic heart disease of native coronary artery without angina pectoris: Secondary | ICD-10-CM

## 2024-09-13 DIAGNOSIS — Z7189 Other specified counseling: Secondary | ICD-10-CM

## 2024-09-13 DIAGNOSIS — Z125 Encounter for screening for malignant neoplasm of prostate: Secondary | ICD-10-CM

## 2024-09-13 DIAGNOSIS — E782 Mixed hyperlipidemia: Secondary | ICD-10-CM | POA: Diagnosis not present

## 2024-09-13 DIAGNOSIS — Z0000 Encounter for general adult medical examination without abnormal findings: Secondary | ICD-10-CM | POA: Diagnosis not present

## 2024-09-13 LAB — PSA: PSA: 0.37 ng/mL (ref 0.10–4.00)

## 2024-09-13 LAB — COMPREHENSIVE METABOLIC PANEL WITH GFR
ALT: 30 U/L (ref 0–53)
AST: 22 U/L (ref 0–37)
Albumin: 5 g/dL (ref 3.5–5.2)
Alkaline Phosphatase: 53 U/L (ref 39–117)
BUN: 16 mg/dL (ref 6–23)
CO2: 29 meq/L (ref 19–32)
Calcium: 10.1 mg/dL (ref 8.4–10.5)
Chloride: 102 meq/L (ref 96–112)
Creatinine, Ser: 1.02 mg/dL (ref 0.40–1.50)
GFR: 79.36 mL/min (ref >60.00)
Glucose, Bld: 96 mg/dL (ref 70–99)
Potassium: 4.3 meq/L (ref 3.5–5.1)
Sodium: 139 meq/L (ref 135–145)
Total Bilirubin: 0.5 mg/dL (ref 0.2–1.2)
Total Protein: 7.4 g/dL (ref 6.0–8.3)

## 2024-09-13 LAB — CBC WITH DIFFERENTIAL/PLATELET
Basophils Absolute: 0 K/uL (ref 0.0–0.1)
Basophils Relative: 0.5 % (ref 0.0–3.0)
Eosinophils Absolute: 0.2 K/uL (ref 0.0–0.7)
Eosinophils Relative: 2.4 % (ref 0.0–5.0)
HCT: 46.5 % (ref 39.0–52.0)
Hemoglobin: 15.6 g/dL (ref 13.0–17.0)
Lymphocytes Relative: 23.6 % (ref 12.0–46.0)
Lymphs Abs: 1.7 K/uL (ref 0.7–4.0)
MCHC: 33.6 g/dL (ref 30.0–36.0)
MCV: 87.7 fl (ref 78.0–100.0)
Monocytes Absolute: 0.7 K/uL (ref 0.1–1.0)
Monocytes Relative: 10.6 % (ref 3.0–12.0)
Neutro Abs: 4.4 K/uL (ref 1.4–7.7)
Neutrophils Relative %: 62.9 % (ref 43.0–77.0)
Platelets: 191 K/uL (ref 150.0–400.0)
RBC: 5.3 Mil/uL (ref 4.22–5.81)
RDW: 13.8 % (ref 11.5–15.5)
WBC: 7.1 K/uL (ref 4.0–10.5)

## 2024-09-13 LAB — LIPID PANEL
Cholesterol: 148 mg/dL (ref 0–200)
HDL: 59.8 mg/dL (ref >39.00)
LDL Cholesterol: 56 mg/dL (ref 0–99)
NonHDL: 87.88
Total CHOL/HDL Ratio: 2
Triglycerides: 158 mg/dL — ABNORMAL HIGH (ref 0.0–149.0)
VLDL: 31.6 mg/dL (ref 0.0–40.0)

## 2024-09-13 MED ORDER — REPATHA SURECLICK 140 MG/ML ~~LOC~~ SOAJ: 1.0000 mL | SUBCUTANEOUS | Status: AC

## 2024-09-13 MED ORDER — DIAZEPAM 5 MG PO TABS: 2.5000 mg | ORAL_TABLET | Freq: Every day | ORAL | 0 refills | Status: AC | PRN

## 2024-09-13 NOTE — Progress Notes (Unsigned)
 CPE- See plan.  Routine anticipatory guidance given to patient.  See health maintenance.  The possibility exists that previously documented standard health maintenance information may have been brought forward from a previous encounter into this note.  If needed, that same information has been updated to reflect the current situation based on today's encounter.    He has a flight coming up, d/w pt about BZD use.  Anxiety d/w pt.  Exercise helps.  D/w p about counseling.    Tetanus 2023 Flu done at work yearly, to be done next week.   PNA not due.  Shingles d/w pt.   Covid vaccine prev done Colonoscopy 2023.   Prostate cancer screening and PSA options (with potential risks and benefits of testing vs not testing) were discussed along with recent recs/guidelines.  He opted for testing PSA at this point.  Living will d/w pt.  Wife designated if patient were incapacitated.   Diet and exercise d/w pt.  He is working on both.    Elevated Cholesterol: Using medications without problems: yes Muscle aches:  less aches taking repatha  every 28 days.  He has been doing that for about 4-5 months.  Diet compliance: yes Exercise:yes Labs pending.  Last dose of repatha  was 1 week ago.   CAD. Prev calcium  scoring d/w pt.  Imaging reviewed with patient.  He prev had pain across his chest 08/13/24 and again on 08/19/24, during stressful events at work.  No exertion pain.  He can walk quickly on the treadmill at an incline for 45 min w/o pain or SOB.  He is taking repatha  every 28 days.    PMH and SH reviewed  Meds, vitals, and allergies reviewed.   ROS: Per HPI.  Unless specifically indicated otherwise in HPI, the patient denies:  General: fever. Eyes: acute vision changes ENT: sore throat Cardiovascular: chest pain Respiratory: SOB GI: vomiting GU: dysuria Musculoskeletal: acute back pain Derm: acute rash Neuro: acute motor dysfunction Psych: worsening mood Endocrine: polydipsia Heme:  bleeding Allergy: hayfever  GEN: nad, alert and oriented HEENT: mucous membranes moist NECK: supple w/o LA CV: rrr. PULM: ctab, no inc wob ABD: soft, +bs EXT: no edema SKIN: no acute rash

## 2024-09-13 NOTE — Patient Instructions (Addendum)
 Go to the lab on the way out.   If you have mychart we'll likely use that to update you.    I would get a flu shot each fall.    Take care.  Glad to see you. Let me know if you can't get set up with counseling.

## 2024-09-14 DIAGNOSIS — I2510 Atherosclerotic heart disease of native coronary artery without angina pectoris: Secondary | ICD-10-CM | POA: Insufficient documentation

## 2024-09-14 NOTE — Assessment & Plan Note (Signed)
 He has a flight coming up, d/w pt about BZD use.  Anxiety d/w pt.  Exercise helps.  D/w p about counseling.  Referral placed.

## 2024-09-14 NOTE — Assessment & Plan Note (Signed)
 Prev calcium  scoring d/w pt.  Imaging reviewed with patient.  He prev had pain across his chest 08/13/24 and again on 08/19/24, during stressful events at work.  No exertion pain.  He can walk quickly on the treadmill at an incline for 45 min w/o pain or SOB.  He is taking repatha  every 28 days.  He does not have typical exertional angina.  I am going to update cardiology about his labs.  I gave routine cautions to the patient.

## 2024-09-14 NOTE — Assessment & Plan Note (Signed)
 less aches taking repatha  every 28 days.  He has been doing that for about 4-5 months.  Last dose of repatha  was 1 week ago.  See notes on labs.

## 2024-09-14 NOTE — Assessment & Plan Note (Signed)
 Tetanus 2023 Flu done at work yearly, to be done next week.   PNA not due.  Shingles d/w pt.   Covid vaccine prev done Colonoscopy 2023.   Prostate cancer screening and PSA options (with potential risks and benefits of testing vs not testing) were discussed along with recent recs/guidelines.  He opted for testing PSA at this point.  Living will d/w pt.  Wife designated if patient were incapacitated.   Diet and exercise d/w pt.  He is working on both.

## 2024-09-14 NOTE — Assessment & Plan Note (Signed)
 Living will d/w pt.  Wife designated if patient were incapacitated.   ?

## 2024-09-18 ENCOUNTER — Ambulatory Visit: Payer: Self-pay | Admitting: Family Medicine

## 2024-10-11 ENCOUNTER — Encounter (INDEPENDENT_AMBULATORY_CARE_PROVIDER_SITE_OTHER): Payer: Self-pay | Admitting: Otolaryngology

## 2024-10-11 ENCOUNTER — Ambulatory Visit (INDEPENDENT_AMBULATORY_CARE_PROVIDER_SITE_OTHER): Payer: No Typology Code available for payment source | Admitting: Audiology

## 2024-10-11 ENCOUNTER — Ambulatory Visit (INDEPENDENT_AMBULATORY_CARE_PROVIDER_SITE_OTHER): Payer: No Typology Code available for payment source | Admitting: Otolaryngology

## 2024-10-11 VITALS — BP 162/96 | HR 69 | Ht 65.0 in | Wt 160.0 lb

## 2024-10-11 DIAGNOSIS — H9042 Sensorineural hearing loss, unilateral, left ear, with unrestricted hearing on the contralateral side: Secondary | ICD-10-CM | POA: Diagnosis not present

## 2024-10-11 DIAGNOSIS — R42 Dizziness and giddiness: Secondary | ICD-10-CM

## 2024-10-11 DIAGNOSIS — H9313 Tinnitus, bilateral: Secondary | ICD-10-CM | POA: Diagnosis not present

## 2024-10-11 NOTE — Progress Notes (Signed)
  [address], [address] [place], [place] [address] [phone]  Audiological Evaluation    Name: Cameron Larsen     DOB:   03/19/1961      MRN:   [id number]                                                                                     Service Date: 10/11/2024     Accompanied by: unaccompanied   Patient comes today after Dr. Tobie, ENT sent a referral for a hearing evaluation due to concerns with hearing loss.   Symptoms Yes Details  Hearing loss  [x]  10-13-2023: Right ear- Normal hearing from [phone] Hz,with a slight notch at 4000Hz . Left ear-  Normal hearing from [phone] Hz, then a mild presumably sensorineural hearing loss at 8000 Hz.     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 Now wears hearing protection.  Previous ear surgeries  []    Family history of hearing loss  [x]  With age  Amplification  []    Other  []      Otoscopy: Right ear: Clear external ear canal and notable landmarks visualized on the tympanic membrane. Left ear:  Clear external ear canal and notable landmarks visualized on the tympanic membrane.  Tympanometry: Right ear: Normal external ear canal volume with normal middle ear pressure and tympanic membrane compliance (Type A). Findings are suggestive of normal middle ear function. Left ear: Normal external ear canal volume with normal middle ear pressure and tympanic membrane compliance (Type A). Findings are suggestive of normal middle ear function.  Hearing Evaluation The hearing test results were completed under headphones and results are deemed to be of good reliability. Test technique:  conventional    Pure tone Audiometry: Right ear- Normal to borderline normal hearing from [phone] Hz. Left ear-  Normal hearing from [phone] Hz, then a mild presumably sensorineural hearing loss at 8000  Hz.     Speech Audiometry: Right ear- Speech Reception Threshold (SRT) was obtained at 10 dBHL. Left ear-Speech Reception Threshold (SRT) was obtained at 15 dBHL.   Word Recognition Score Tested using NU-6 (recorded) Right ear: 100% was obtained at a presentation level of 50 dBHL with contralateral masking which is deemed as  excellent. Left ear: 100% was obtained at a presentation level of 50 dBHL with contralateral masking which is deemed as  excellent.   Impression: There is not a significant difference in pure-tone thresholds between ears. There is not a significant difference in the word recognition score in between ears.    Recommendations: Follow up with ENT as scheduled for today. Return for a hearing evaluation if concerns with hearing changes arise or per MD recommendation. Use hearing protection when exposed to loud/damaging sounds.  Consider various tinnitus strategies, including the use of a sound generator, and/or tinnitus retraining therapy.    Alvenia Treese MARIE LEROUX-MARTINEZ, AUD

## 2024-10-11 NOTE — Patient Instructions (Signed)
 Lipoflavonoid (buy it over the counter)

## 2024-10-11 NOTE — Progress Notes (Signed)
 Dear Dr. Cleatus, Here is my assessment for our mutual patient, Cameron Larsen. Thank you for allowing me the opportunity to care for your patient. Please do not hesitate to contact me should you have any other questions. Sincerely, Dr. Eldora Blanch  Otolaryngology Clinic Note Referring provider: Dr. Cleatus HPI:  Cameron Larsen is a 63 y.o. male kindly referred by Dr. Cleatus for evaluation of vertigo.  Initial visit (2024): Patient reports that on Labor Day, he was in bed, and he rolled over and had a episode of dizziness (room  was spinning for maybe a few seconds). Then got up, went to kitchen, and then had a headache and same dizziness episode. This has resolved. Went to PCP around then, who ordered an MRI. Prescribed Meclizine  but only took it one time. He will have intermittent episodes now, most frequently when turning over in bed. Only lasts a few seconds. Having it maybe once a week, but may has not had it for a few weeks now  No prior URI.  Family history of hearing loss: later in life. Patient, mom, and his grandmother. Mom had stapedectomy  Ear wise: Never had a hearing test, but reports bilateral tinnitus for past 30 years. High pitched, buzzing. Nothing makes it better or worse. Not interfering with sleep. Worse when quiet.  Patient denies: ear pain, fullness, drainage. 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 No issues with vertigo. No issues with ear pain, drainage, fullness. Continues to have tinnitus bilateral non-pulsatile. No problems or issues wit hhearing.   H&N Surgery: ACDF prior Personal or FHx of bleeding dz or anesthesia  difficulty: no   GLP-1: no AP/AC: no  Tobacco: no. Occupation: Sales  Independent Review of Additional Tests or Records:  PCP notes reviewed MRI (08/2023): no obvious significant large retrocochlear masses        10/13/2023 Audiogram was independently reviewed and interpreted by me and it reveals B/l A/A tymps Right ear- Normal hearing from [phone],with a slight notch at 4000Hz . Left ear-  Normal hearing from [phone], then a mild presumably sensorineural hearing loss at 8000 Hz.  WRT 100% at 60dB   SNHL= Sensorineural hearing loss 10/2024 Audiogram was independently reviewed and interpreted by me and it reveals - A/A tymps; stable compared to audio in 2024  SNHL= Sensorineural hearing loss   PMH/Meds/All/SocHx/FamHx/ROS:   Past Medical History:  Diagnosis Date   Allergy    SEASONAL   GERD (gastroesophageal reflux disease)    in the past   Herniated cervical disc    surgery 2017   Hyperlipidemia      Past Surgical History:  Procedure Laterality Date   CERVICAL FUSION  2017   COLONOSCOPY     DENTAL SURGERY     POLYPECTOMY      Family History  Problem Relation Age of Onset   Breast cancer Mother    Uterine cancer Mother    Coronary artery disease Father        CABG 2008   Hyperlipidemia Father    Bladder Cancer Father    Coronary artery disease Other    Hearing loss Other    Prostate cancer Neg Hx    Colon cancer Neg Hx    Esophageal cancer Neg Hx  Colon polyps Neg Hx    Rectal cancer Neg Hx    Stomach cancer Neg Hx    Crohn's disease Neg Hx    Ulcerative colitis Neg Hx      Social Connections: Not on file      Current Outpatient Medications:    aspirin  EC 81 MG tablet, Take 1 tablet (81 mg total) by mouth daily., Disp: 90 tablet, Rfl: 3   Cholecalciferol (VITAMIN D -3) 5000 units TABS, Take 5,000 Int'l Units by mouth daily., Disp: , Rfl:    Co-Enzyme Q-10 100 MG CAPS, Take 300 mg by mouth daily., Disp: , Rfl:    diazepam  (VALIUM ) 5  MG tablet, Take 0.5-1 tablets (2.5-5 mg total) by mouth daily as needed for anxiety., Disp: 5 tablet, Rfl: 0   Evolocumab  (REPATHA  SURECLICK) 140 MG/ML SOAJ, Inject 140 mg into the skin every 28 (twenty-eight) days., Disp: , Rfl:    ezetimibe  (ZETIA ) 10 MG tablet, TAKE 1 TABLET BY MOUTH DAILY. PLEASE CONTACT [facility] FOR AN APPOINTMENT., Disp: 90 tablet, Rfl: 0   Krill Oil 300 MG CAPS, Take 1 capsule by mouth daily., Disp: , Rfl:    Multiple Vitamin (MULTIVITAMIN ADULT PO), Take by mouth daily. CENTRUM SILVER,50 +, Disp: , Rfl:    Probiotic Product (PROBIOTIC DAILY) CAPS, Take 1 capsule by mouth daily., Disp: , Rfl:    scopolamine  (TRANSDERM-SCOP) 1 MG/3DAYS, Place 1 patch (1.5 mg total) onto the skin every 3 (three) days. (Patient taking differently: Place 1 patch onto the skin as needed.), Disp: 10 patch, Rfl: 1  Current Facility-Administered Medications:    0.9 %  sodium chloride  infusion, 500 mL, Intravenous, Continuous, Armbruster, Elspeth SQUIBB, MD   Physical Exam:   There were no vitals taken for this visit.  Salient findings:  CN II-XII intact  Bilateral EAC clear and TM intact with well pneumatized middle ear spaces Weber 512: midline Rinne 512: AC > BC b/l  Rine 1024: AC > BC b/l  No gross nystagmus Headshake negative Dix-hallpike negative today No respiratory distress or stridor  Seprately Identifiable Procedures:  None  Impression & Plans:  [name] is a 63 y.o. 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 Tinnitus -- b/l, non-pulsatile. We discussed mgmt and he would like to try flavonoids and white noise machine.  Given stable hearing thresholds, will observe and f/u PRN  Thank you for allowing me the  opportunity to care for your patient. Please do not hesitate to contact me should you have any other questions.  Sincerely, Eldora Blanch, MD Otolaryngologist (ENT), [facility] Phone: [phone] Fax: [phone]  10/11/2024, 10:38 AM   MDM:  Level 3 -- 99213 Complexity/Problems addressed: low Data complexity: low - Morbidity: low

## 2024-10-12 ENCOUNTER — Ambulatory Visit (INDEPENDENT_AMBULATORY_CARE_PROVIDER_SITE_OTHER): Payer: No Typology Code available for payment source

## 2024-10-18 ENCOUNTER — Encounter: Payer: Self-pay | Admitting: Audiology

## 2024-11-01 ENCOUNTER — Ambulatory Visit: Admitting: Clinical

## 2024-11-01 DIAGNOSIS — F411 Generalized anxiety disorder: Secondary | ICD-10-CM

## 2024-11-01 NOTE — Progress Notes (Signed)
   Darice Seats, LCSW

## 2024-11-01 NOTE — Progress Notes (Signed)
  Behavioral Health Counselor Initial Adult Exam  Name: Cameron Larsen Date: 11/01/2024 MRN: [id number] DOB: [birth year] PCP: Cleatus Arlyss RAMAN, MD  Time spent: 9:33am - 10:16am  Guardian/Payee:  NA    Paperwork requested: NA  Reason for Visit /Presenting Problem: Patient stated, I had a physical several months ago with Dr. Cleatus, and I mentioned to him anxiety and that's what got me here. Patient reported experiencing anxiety since childhood.   Mental Status Exam: Appearance:   Neat and Well Groomed     Behavior:  Appropriate  Motor:  Normal  Speech/Language:   Clear and Coherent and Normal Rate  Affect:  Appropriate  Mood:  anxious  Thought process:  normal  Thought content:    WNL  Sensory/Perceptual disturbances:    WNL  Orientation:  oriented to person, place, situation, month of year, and year  Attention:  Good  Concentration:  Good  Memory:  WNL  Fund of knowledge:   Good  Insight:    Good  Judgment:   Good  Impulse Control:  Good   Reported Symptoms:  Patient reported its moments, its pretty often in reference to symptoms of anxiety. Patient reported nervousness, anxiousness, I can feel my heart rate get to the point that I almost can't control, tightness in chest, irritability, muscle tension, decreased concentration, waking up at 3 or 4am, decreased sleep when feeling anxious, restlessness, feeling on edge. Patient reported symptoms  have been present since childhood (63 y/o). Patient reported slight changes in memory. Patient reported a history of feeling down in response to stressors/situations. Patient reported recent increase in anxiety in response to confinement when flying since MRI experience, fear of heights.   Risk Assessment: Danger to Self:  No Patient denied current and past suicidal ideation, homicidal ideation, symptoms of psychosis.  Self-injurious Behavior: No Danger to Others: No Duty to Warn:no Physical Aggression /  Violence:No  Access to Firearms a concern: No current concern but reported access Gang Involvement:No  Patient / guardian was educated about steps to take if suicide or homicide risk level increases between visits: yes While future psychiatric events cannot be accurately predicted, the patient does not currently require acute inpatient psychiatric care and does not currently meet [place]  involuntary commitment criteria.  Substance Abuse History: Current substance abuse: No   Patient reported alcohol use occasionally with last use over the past weekend. Patient reported no current tobacco use and a history of using tobacco a little bit for several years 30 years ago. Patient reported no current or past drug use.  Past Psychiatric History:   Previous psychological history is significant for anxiety Outpatient Providers: none  History of Psych Hospitalization: No  Psychological Testing: none   Abuse History:  Victim of: No., none   Report needed: No. Victim of Neglect:No. Perpetrator of none reported  Witness / Exposure to Domestic Violence: No   Protective Services Involvement: No  Witness to [employer] Violence:  No   Family History:  Family History  Problem Relation Age of Onset   Breast cancer Mother    Uterine cancer Mother    Coronary artery disease Father        CABG 2008   Hyperlipidemia Father    Bladder Cancer Father    Coronary artery disease Other    Hearing loss Other    Prostate cancer Neg Hx    Colon cancer Neg Hx    Esophageal cancer Neg Hx    Colon polyps Neg Hx  Rectal cancer Neg Hx    Stomach cancer Neg Hx    Crohn's disease Neg Hx    Ulcerative colitis Neg Hx   Per patient family history of anxiety - mother  Living situation: the patient lives with their spouse  Sexual Orientation: Straight  Relationship Status: married  Name of spouse / other: [name] If a parent, number of children / ages: 2 children ages 57, 87  Support Systems:  spouse, daughter  Surveyor, Quantity Stress:  No   Income/Employment/Disability: Employment  Financial Planner: No   Educational History: 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 History of legal issue / charges: none  Medical History/Surgical History: reviewed Past Medical History:  Diagnosis Date   Allergy    SEASONAL   GERD (gastroesophageal reflux disease)    in the past   Herniated cervical disc    surgery 2017   Hyperlipidemia     Past Surgical History:  Procedure Laterality Date   CERVICAL FUSION  2017   COLONOSCOPY     DENTAL SURGERY     POLYPECTOMY      Medications: Current Outpatient Medications  Medication Sig Dispense Refill   aspirin  EC 81 MG tablet Take 1 tablet (81 mg total) by mouth daily. 90 tablet 3   Cholecalciferol (VITAMIN D -3) 5000 units TABS Take 5,000 Int'l Units by mouth daily.     Co-Enzyme Q-10 100 MG CAPS Take 300 mg by mouth daily.     diazepam  (VALIUM ) 5 MG tablet Take 0.5-1 tablets (2.5-5 mg total) by mouth daily as needed for anxiety. 5 tablet 0   Evolocumab  (REPATHA  SURECLICK) 140 MG/ML SOAJ Inject 140 mg into the skin every 28 (twenty-eight) days.     ezetimibe  (ZETIA ) 10 MG tablet TAKE 1 TABLET BY MOUTH DAILY. PLEASE CONTACT [facility] FOR AN APPOINTMENT. 90 tablet 0   Krill Oil 300 MG CAPS Take 1 capsule by mouth daily.     Multiple Vitamin (MULTIVITAMIN ADULT PO) Take by mouth daily.  CENTRUM SILVER,50 +     Probiotic Product (PROBIOTIC DAILY) CAPS Take 1 capsule by mouth daily.     scopolamine  (TRANSDERM-SCOP) 1 MG/3DAYS Place 1 patch (1.5 mg total) onto the skin every 3 (three) days. (Patient taking differently: Place 1 patch onto the skin as needed.) 10 patch 1   Current Facility-Administered Medications  Medication Dose Route Frequency Provider Last Rate Last Admin   0.9 %  sodium chloride  infusion  500 mL Intravenous Continuous Armbruster, Elspeth SQUIBB, MD      Patient reported taking scopolamine  for cruise and for vertigo as needed  Allergies  Allergen Reactions   Cefuroxime Axetil     fever   Ciprofloxacin     fever   Crestor  [Rosuvastatin ]     Aches at 10mg  daily   Lipitor [Atorvastatin ]     Muscle aches on 20 mg qd   Livalo  [Pitavastatin ] Other (See Comments)    aches  Per patient seasonal allergies/pollen  Diagnoses:  Generalized anxiety disorder  Plan of Care: Patient is a 63  year old male who presented for an initial assessment. Clinician conducted initial assessment in person from clinician's office at [facility]. Patient reported the following symptoms: nervousness, anxiousness, increased heart rate, tightness in chest, irritability, muscle tension, decreased concentration, decreased sleep when feeling anxious, restlessness, feeling on edge, slight changes in memory. In addition, patient reported recent increase in anxiety in response to confinement when flying and fear of heights. Patient denied current and past suicidal ideation, homicidal ideation, symptoms of psychosis. Patient reported alcohol use occasionally. Patient reported no current tobacco use and reported a history of tobacco use 30 years ago. Patient reported no current or past drug use. Patient reported no history of outpatient or inpatient behavioral health treatment. Patient reported the following stressors: decision regarding retirement, stress related to caring for aging  parents, increased anxiety when flying and planning for future travel. Patient reported patient's wife and daughter are current supports. It is recommended patient participate in individual therapy biweekly. Clinician will review recommendations and treatment plan with patient during follow up appointment. Treatment plan will be developed during follow up appointment.   Collaboration of Care: Other Patient declined consents at this time  Patient/Guardian was advised Release of Information must be obtained prior to any record release in order to collaborate their care with an outside provider. Patient/Guardian was advised if they have not already done so to contact [facility] to sign all necessary forms in order for us  to release information regarding their care.   Consent: Patient/Guardian gives written consent for treatment and assignment of benefits for services provided during this visit. Patient/Guardian expressed understanding and agreed to proceed.   Darice Seats, LCSW

## 2024-12-20 ENCOUNTER — Ambulatory Visit: Admitting: Clinical

## 2024-12-20 ENCOUNTER — Other Ambulatory Visit: Payer: Self-pay | Admitting: Family Medicine

## 2024-12-20 DIAGNOSIS — F411 Generalized anxiety disorder: Secondary | ICD-10-CM

## 2024-12-20 DIAGNOSIS — E782 Mixed hyperlipidemia: Secondary | ICD-10-CM

## 2024-12-20 NOTE — Progress Notes (Signed)
   Darice Seats, LCSW

## 2024-12-20 NOTE — Progress Notes (Signed)
 "  [place] [facility] Counselor/Therapist Progress Note  Patient ID: Cameron Larsen, MRN: [id number],    Date: 12/20/2024  Time Spent: 2:39pm - 3:24pm : 45 minutes   Treatment Type: Individual Therapy  Reported Symptoms: anxiety  Mental Status Exam: Appearance:  Neat and Well Groomed     Behavior: Appropriate  Motor: Normal  Speech/Language:  Clear and Coherent and Normal Rate  Affect: Appropriate  Mood: normal  Thought process: normal  Thought content:   WNL  Sensory/Perceptual disturbances:   WNL  Orientation: oriented to person, place, time/date, and situation  Attention: Good  Concentration: Good  Memory: WNL  Fund of knowledge:  Good  Insight:   Good  Judgment:  Good  Impulse Control: Good   Risk Assessment: Danger to Self:  No Patient denied current suicidal ideation  Self-injurious Behavior: No Danger to Others: No Patient denied current homicidal ideation Duty to Warn:no Physical Aggression / Violence:No  Access to Firearms a concern: No  Gang Involvement:No   Subjective: Patient reported no changes since last session. Patient stated, good, fine in response to mood since last session. Patient reported no issues since last session. Patient reported moments anxiety pops up, how can I better control those moments. Patient stated, exactly what I was trying to describe in response to diagnosis. Patient stated, I think its a good idea in response to participation in therapy. Patient stated, how to better manage those moments or get ahead of them in reference to anxiety and goals for therapy.   Interventions: Motivational Interviewing. Clinician conducted session in person at clinician's office at [facility]. Clinician reviewed diagnosis and treatment recommendations; and provided psycho education related to diagnosis and treatment as part of treatment planning process. Clinician utilized motivational interviewing to explore potential goals  for therapy. Clinician utilized a task centered approach in collaboration with patient to develop treatment plan. Patient participated in development of treatment plan and verbally agreed to treatment plan.  Diagnosis:Generalized anxiety disorder  Collaboration of Care: Other not required at this time  Plan: Behavioral Health Treatment Plan   [facility]  MRN: [id number]   Treatment Plan Development Date: 12/20/24   Strengths: breathing exercises, reading, supportive relationships   Supports: Spouse and daughter   Client Statement of Needs: At time of assessment patient stated, I had a physical several months ago with Dr. Cleatus, and I mentioned to him anxiety and that's what got me here.    Treatment Level: outpatient therapy  Client Treatment Preferences: in person appointments    Diagnosis: Anxiety  Generalized Anxiety Disorder  Symptoms:  Difficulty managing worry, Restlessness or feeling keyed up or on edge, Difficulty concentrating or mind going blank, Irritability, Muscle Tension, Sleep disturbance (Difficulty falling or staying asleep, restlessness, or unsatisfying sleep, and nervousness, anxiousness  Goals:  Resolve issues and concerns that are resulting in anxiety. and Build and employ tools and skills to reduce symptoms of anxiety, worry, and improve functioning day-to-day.  Objectives: Target Date For All Objectives: 12/20/25  Develop coping tools to manage anxiety including mindfulness, acceptance, relaxation, reframing, and challenging negative thoughts and feelings., Identify, challenge, and manage negative self-talk, negative thinking about self and others, and maintain mindfulness regarding self-talk and cognitive distortions., and Identify contributing factors to anxiety including previous or current situations.  Progress Documentation:  Established 12/20/24  Interventions:  CBT - reframing, challenging,  cognitive restructuring, Relaxation and mindfulness, and Problem Solving   Expected duration of treatment: 12 months  Party responsible for implementation of interventions: Cameron Larsen, MSW, LCSW, therapist and Cameron Larsen, patient.   This plan has been reviewed and created by the following participants: Cameron Larsen, MSW, LCSW, therapist and Cameron Larsen, patient.   A new plan will be created at least every 12 months.  The patient fully participated in the development of treatment plan with the clinician and verbally consents to such treatment.   Patient Treatment Plan Signature Obtained: Yes, please see shared [address] for sign off.    Cameron Seats, LCSW    "

## 2024-12-21 NOTE — Telephone Encounter (Signed)
 Refill request has message that pt needs an appt. However, neither 09/13/24 OV notes not lab results state anything about scheduling f/u OV.   Spoke with pt asking if still taking Zetia . Confirms he does.   Last filled:  11/15/24, #90 Last OV:  09/13/24, CPE Next OV:  none

## 2025-01-11 NOTE — Progress Notes (Unsigned)
 " [facility] Claud[facility] [address] [place] [address] Phone: [phone] Subjective:   ISusannah Gully, am serving as a scribe for Dr. Arthea Claudene.  I'm seeing this patient by the request  of:  Cleatus Arlyss RAMAN, MD  CC: Heel pain  YEP:Dlagzrupcz  JAEDEN MESSER III is a 64 y.o. male coming in with complaint of heel pain. B pain after activity. Feels like bone on bone pain that last about 24 hours. Has gotten progressively worse with normal ADL. No home therapies. Started about 2 months ago.      Past Medical History:  Diagnosis Date   Allergy    SEASONAL   GERD (gastroesophageal reflux disease)    in the past   Herniated cervical disc    surgery 2017   Hyperlipidemia    Past Surgical History:  Procedure Laterality Date   CERVICAL FUSION  2017   COLONOSCOPY     DENTAL SURGERY     POLYPECTOMY     Social History   Socioeconomic History   Marital status: Married    Spouse name: Not on file   Number of children: 2   Years of education: Not on file   Highest education level: Not on file  Occupational History   Occupation: Airline Pilot - Garment/textile Technologist: [employer]  Tobacco Use   Smoking status: Former    Passive exposure: Never   Smokeless tobacco: Never   Tobacco comments:    30 yrs ago in college  Vaping Use   Vaping status: Never Used  Substance and Sexual Activity   Alcohol use: Yes    Comment: 6 pack or less per week   Drug use: No   Sexual activity: Yes    Partners: Female  Other Topics Concern   Not on file  Social History Narrative   HSG, [employer]. Married '92. 1 - Son '95; 1 daughter '00. 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    Social Drivers of Health   Tobacco Use: Medium Risk (10/11/2024)   Patient History    Smoking Tobacco Use: Former    Smokeless Tobacco Use: Never    Passive Exposure: Never  Field Seismologist: Not on file  Food Insecurity: Not on file  Transportation Needs: Not on file  Physical Activity: Not on file  Stress: Not on file  Social Connections: Not on file  Depression (PHQ2-9): Low Risk (09/13/2024)   Depression (PHQ2-9)    PHQ-2 Score: 2  Alcohol Screen: Not on file  Housing: Not on file  Utilities: Not on file  Health Literacy: Not on file   Allergies[1] Family History  Problem Relation Age of Onset   Breast cancer Mother    Uterine cancer Mother    Coronary artery disease Father        CABG 2008   Hyperlipidemia Father    Bladder Cancer Father    Coronary artery disease Other    Hearing loss Other    Prostate cancer Neg Hx    Colon cancer Neg Hx    Esophageal cancer Neg Hx    Colon polyps Neg Hx    Rectal cancer Neg Hx    Stomach cancer Neg Hx    Crohn's disease Neg Hx    Ulcerative colitis Neg Hx     Current Outpatient Medications (Cardiovascular):  Evolocumab  (REPATHA  SURECLICK) 140 MG/ML SOAJ, Inject 140 mg into the skin every 28 (twenty-eight) days.   ezetimibe  (ZETIA ) 10 MG tablet, TAKE 1 TABLET BY MOUTH DAILY. PLEASE CONTACT [facility] FOR AN APPOINTMENT.  Current Outpatient Medications (Analgesics):    aspirin  EC 81 MG tablet, Take 1 tablet (81 mg total) by mouth daily.  Current Outpatient Medications (Other):    Cholecalciferol (VITAMIN D -3) 5000 units TABS, Take 5,000 Int'l Units by mouth daily.   Co-Enzyme Q-10 100 MG CAPS, Take 300 mg by mouth daily.   diazepam  (VALIUM ) 5 MG tablet, Take 0.5-1 tablets (2.5-5 mg total) by mouth daily as needed for anxiety.   Krill Oil 300 MG CAPS, Take 1 capsule by mouth daily.   Multiple Vitamin (MULTIVITAMIN ADULT PO), Take by mouth daily. CENTRUM SILVER,50 +   Probiotic Product (PROBIOTIC DAILY) CAPS, Take 1 capsule by mouth daily.   scopolamine  (TRANSDERM-SCOP) 1 MG/3DAYS, Place 1 patch (1.5 mg total) onto the skin every 3 (three) days. (Patient taking differently: Place 1 patch  onto the skin as needed.)  Current Facility-Administered Medications (Other):    0.9 %  sodium chloride  infusion   Reviewed prior external information including notes and imaging from  primary care provider As well as notes that were available from care everywhere and other healthcare systems.  Past medical history, social, surgical and family history all reviewed in electronic medical record.  No pertanent information unless stated regarding to the chief complaint.   Review of Systems:  No headache, visual changes, nausea, vomiting, diarrhea, constipation, dizziness, abdominal pain, skin rash, fevers, chills, night sweats, weight loss, swollen lymph nodes, body aches, joint swelling, chest pain, shortness of breath, mood changes. POSITIVE muscle aches  Objective  Blood pressure 124/86, pulse 97, height 5' 5 (1.651 m), weight 160 lb (72.6 kg), SpO2 99%.   General: No apparent distress alert and oriented x3 mood and affect normal, dressed appropriately.  HEENT: Pupils equal, extraocular movements intact  Respiratory: Patient's speak in full sentences and does not appear short of breath  Cardiovascular: No lower extremity edema, non tender, no erythema  Heel exam shows no significant abnormal findings noted.  Mild tightness noted in the posterior cord.  Patient is really nontender on exam with some very mild discomfort over the posterior tibialis tendon area.  Limited muscular skeletal ultrasound was performed and interpreted by CLAUDENE HUSSAR, M  Limited study shows some very mild dilatation of the posterior tibialis nerve with some increasing discomfort with compression.  Achilles appears to be unremarkable with a very small Achilles calcaneal spur.  Patient's plantar fascia is unremarkable.    Impression and Recommendations:      The above documentation has been reviewed and is accurate and complete Hussar CHRISTELLA Claudene, DO      [1]  Allergies Allergen Reactions   Cefuroxime  Axetil     fever   Ciprofloxacin     fever   Crestor  [Rosuvastatin ]     Aches at 10mg  daily   Lipitor [Atorvastatin ]     Muscle aches on 20 mg qd   Livalo  [Pitavastatin ] Other (See Comments)    aches   "

## 2025-01-13 ENCOUNTER — Ambulatory Visit: Admitting: Family Medicine

## 2025-01-13 ENCOUNTER — Other Ambulatory Visit: Payer: Self-pay

## 2025-01-13 VITALS — BP 124/86 | HR 97 | Ht 65.0 in | Wt 160.0 lb

## 2025-01-13 DIAGNOSIS — M79671 Pain in right foot: Secondary | ICD-10-CM

## 2025-01-13 NOTE — Patient Instructions (Addendum)
 Do prescribed exercises at least 3x a week Recover sandals in the house Go shoe shopping [employer] Support orthotics Eat within 30 mins of working out See you again in 2 months

## 2025-01-25 ENCOUNTER — Ambulatory Visit: Admitting: Clinical

## 2025-02-09 ENCOUNTER — Ambulatory Visit: Admitting: Clinical

## 2025-03-14 ENCOUNTER — Ambulatory Visit: Admitting: Family Medicine
